# Patient Record
Sex: Male | Born: 1975 | Race: White | Hispanic: No | Marital: Married | Smoking: Current every day smoker
Health system: Southern US, Community
[De-identification: ages and names within clinical notes are randomized; demographics above are authoritative.]

## PROBLEM LIST (undated history)

## (undated) DIAGNOSIS — Z720 Tobacco use: Secondary | ICD-10-CM

## (undated) DIAGNOSIS — Z860100 Personal history of colon polyps, unspecified: Secondary | ICD-10-CM

## (undated) DIAGNOSIS — M1990 Unspecified osteoarthritis, unspecified site: Secondary | ICD-10-CM

## (undated) DIAGNOSIS — R0600 Dyspnea, unspecified: Secondary | ICD-10-CM

## (undated) DIAGNOSIS — R519 Headache, unspecified: Secondary | ICD-10-CM

## (undated) DIAGNOSIS — R635 Abnormal weight gain: Secondary | ICD-10-CM

## (undated) DIAGNOSIS — J189 Pneumonia, unspecified organism: Secondary | ICD-10-CM

## (undated) DIAGNOSIS — R42 Dizziness and giddiness: Secondary | ICD-10-CM

## (undated) DIAGNOSIS — G4730 Sleep apnea, unspecified: Secondary | ICD-10-CM

## (undated) DIAGNOSIS — R9431 Abnormal electrocardiogram [ECG] [EKG]: Secondary | ICD-10-CM

## (undated) DIAGNOSIS — J40 Bronchitis, not specified as acute or chronic: Secondary | ICD-10-CM

## (undated) DIAGNOSIS — R609 Edema, unspecified: Secondary | ICD-10-CM

## (undated) DIAGNOSIS — R5383 Other fatigue: Secondary | ICD-10-CM

## (undated) HISTORY — DX: Personal history of colonic polyps: Z86.010

## (undated) HISTORY — DX: Abnormal weight gain: R63.5

## (undated) HISTORY — PX: OTHER SURGICAL HISTORY: SHX169

## (undated) HISTORY — DX: Tobacco use: Z72.0

## (undated) HISTORY — DX: Edema, unspecified: R60.9

## (undated) HISTORY — DX: Abnormal electrocardiogram (ECG) (EKG): R94.31

## (undated) HISTORY — PX: NECK SURGERY: SHX720

## (undated) HISTORY — DX: Other fatigue: R53.83

## (undated) HISTORY — DX: Dyspnea, unspecified: R06.00

## (undated) HISTORY — PX: POLYPECTOMY: SHX149

## (undated) HISTORY — DX: Personal history of colon polyps, unspecified: Z86.0100

## (undated) HISTORY — DX: Dizziness and giddiness: R42

## (undated) HISTORY — DX: Morbid (severe) obesity due to excess calories: E66.01

## (undated) HISTORY — DX: Sleep apnea, unspecified: G47.30

---

## 1999-09-07 HISTORY — PX: COLONOSCOPY: SHX174

## 2000-02-25 ENCOUNTER — Ambulatory Visit (HOSPITAL_COMMUNITY): Admission: RE | Admit: 2000-02-25 | Discharge: 2000-02-25 | Payer: Self-pay | Admitting: *Deleted

## 2002-07-26 ENCOUNTER — Emergency Department (HOSPITAL_COMMUNITY): Admission: EM | Admit: 2002-07-26 | Discharge: 2002-07-26 | Payer: Self-pay | Admitting: Emergency Medicine

## 2006-03-22 ENCOUNTER — Encounter: Admission: RE | Admit: 2006-03-22 | Discharge: 2006-03-22 | Payer: Self-pay | Admitting: Family Medicine

## 2006-09-09 ENCOUNTER — Emergency Department (HOSPITAL_COMMUNITY): Admission: EM | Admit: 2006-09-09 | Discharge: 2006-09-09 | Payer: Self-pay | Admitting: Family Medicine

## 2020-02-08 ENCOUNTER — Telehealth: Payer: Self-pay

## 2020-02-08 NOTE — Telephone Encounter (Signed)
NOTES ON FILE FROM [facility] [phone], SENT REFERRAL TO SCHEDULING 

## 2020-02-11 ENCOUNTER — Encounter: Payer: Self-pay | Admitting: *Deleted

## 2020-04-02 ENCOUNTER — Other Ambulatory Visit: Payer: Self-pay

## 2020-04-02 ENCOUNTER — Telehealth: Payer: Self-pay | Admitting: *Deleted

## 2020-04-02 ENCOUNTER — Encounter: Payer: Self-pay | Admitting: Cardiology

## 2020-04-02 ENCOUNTER — Ambulatory Visit (INDEPENDENT_AMBULATORY_CARE_PROVIDER_SITE_OTHER): Payer: 59 | Admitting: Cardiology

## 2020-04-02 VITALS — BP 126/70 | HR 103 | Ht 77.0 in | Wt 339.0 lb

## 2020-04-02 DIAGNOSIS — R9431 Abnormal electrocardiogram [ECG] [EKG]: Secondary | ICD-10-CM

## 2020-04-02 DIAGNOSIS — F17200 Nicotine dependence, unspecified, uncomplicated: Secondary | ICD-10-CM | POA: Diagnosis not present

## 2020-04-02 DIAGNOSIS — Z8249 Family history of ischemic heart disease and other diseases of the circulatory system: Secondary | ICD-10-CM | POA: Diagnosis not present

## 2020-04-02 NOTE — Telephone Encounter (Signed)
Opened in error

## 2020-04-02 NOTE — Progress Notes (Signed)
Cardiology Office Note:    Date:  04/02/2020   ID:  Jorge Mcmahon, DOB 20-Oct-1975, MRN [id number]  PCP:  Patient, No Pcp Per  [facility] Cardiologist:  Candee Furbish, MD  [facility] Electrophysiologist:  None   Referring MD: Philmore Pali, NP     History of Present Illness:    Jorge Mcmahon is a 44 y.o. male here for the evaluation of abnormal EKG at the request of Charlott Holler, NP.  In review of outside records, he had been feeling poorly with decreased exercise tolerance dyspnea edema fatigue lightheadedness weight gain.  Has tobacco use family history of CAD with his father dying from heart failure and uncle dying from massive MI at age 74.  During his occupational physicals over the past 2 years each time his EKG has shown prolonged QT and this was new to the patient.  He works as a Dealer.  LDL 91 triglycerides 92.   BMI 40, 328 pounds previously.  -Covid test was negative.  QTC on EKG from outside hospital personally interpreted demonstrates 444 ms, QT is 390 ms.  Sinus rhythm otherwise.  Normal QRS duration of 107 ms.  This EKG was on 01/28/2020.  Additional EKG shows QTC of 426 ms. In general, the 99th percentile QTc values are 460 milliseconds (prepuberty), 470 milliseconds in postpubertal males, and 480 milliseconds in postpubertal females.   He has not complained of any high risk symptoms such as unexplained syncope or syncope during exercise.  Wife present during visit  Past Medical History:  Diagnosis Date  . Abnormal EKG   . Dyspnea   . Edema   . Fatigue   . Lightheaded   . Morbid obesity ([place])   . Tobacco abuse   . Weight gain     Current Medications: No outpatient medications have been marked as taking for the 04/02/20 encounter (Office Visit) with Jerline Pain, MD.     Allergies:   Sulfa antibiotics   Social History   Socioeconomic History  . Marital status: Married    Spouse name: Not on file  . Number of children: Not on file  . Years  of education: Not on file  . Highest education level: Not on file  Occupational History  . Not on file  Tobacco Use  . Smoking status: Current Every Day Smoker  . Smokeless tobacco: Never Used  Substance and Sexual Activity  . Alcohol use: Never  . Drug use: Never  . Sexual activity: Not on file  Other Topics Concern  . Not on file  Social History Narrative  . Not on file   Social Determinants of Health   Financial Resource Strain:   . Difficulty of Paying Living Expenses:   Food Insecurity:   . Worried About Charity fundraiser in the Last Year:   . Arboriculturist in the Last Year:   Transportation Needs:   . Film/video editor (Medical):   Marland Kitchen Lack of Transportation (Non-Medical):   Physical Activity:   . Days of Exercise per Week:   . Minutes of Exercise per Session:   Stress:   . Feeling of Stress :   Social Connections:   . Frequency of Communication with Friends and Family:   . Frequency of Social Gatherings with Friends and Family:   . Attends Religious Services:   . Active Member of Clubs or Organizations:   . Attends Archivist Meetings:   Marland Kitchen Marital Status:  Family History: The patient's family history includes Asthma in his maternal grandmother; Heart Problems in his maternal grandfather and paternal uncle; Heart disease in his father; Hyperlipidemia in his mother; Hypertension in his maternal grandfather and maternal grandmother.  ROS:   Please see the history of present illness.    Denies any fevers chills nausea vomiting syncope bleeding all other systems reviewed and are negative.  EKGs/Labs/Other Studies Reviewed:    The following studies were reviewed today: Prior EKGs reviewed and personally interpreted, all prior office notes reviewed  EKG:  EKG is  ordered today.  The ekg ordered today demonstrates sinus tachycardia 103 with QT interval measured at 376 ms, QTC currently measured by EKG at 492 however I think this is an  overestimation.  See chart for further details.  Recent Labs: No results found for requested labs within last 8760 hours.  Recent Lipid Panel No results found for: CHOL, TRIG, HDL, CHOLHDL, VLDL, LDLCALC, LDLDIRECT  Physical Exam:    VS:  BP 126/70   Pulse 103   Ht 6\' 5"  (1.956 m)   Wt (!) 339 lb (153.8 kg)   SpO2 94%   BMI 40.20 kg/m     Wt Readings from Last 3 Encounters:  04/02/20 (!) 339 lb (153.8 kg)     GEN: Overweight well nourished, well developed in no acute distress HEENT: Normal NECK: No JVD; No carotid bruits LYMPHATICS: No lymphadenopathy CARDIAC: RRR, no murmurs, rubs, gallops RESPIRATORY:  Clear to auscultation without rales, wheezing or rhonchi  ABDOMEN: Soft, non-tender, non-distended MUSCULOSKELETAL:  No edema; No deformity  SKIN: Warm and dry NEUROLOGIC:  Alert and oriented x 3 PSYCHIATRIC:  Normal affect   ASSESSMENT:    1. Family history of coronary artery disease   2. Abnormal electrocardiogram   3. Smoker    PLAN:    In order of problems listed above:  Abnormal ECG -This is borderline prolonged QT.  It is not on average greater than 470 ms QTC.  On caliper measurement, QT interval is actually 376 on current EKG.  He has not had any high risk symptoms such as unexplained syncope or syncope during exercise.  He has not had any young cousins or young first-degree relatives that have died suddenly. -I will check an echocardiogram to ensure proper structure and function of his heart especially given his symptoms of shortness of breath which could be multifactorial especially given weight, smoking.  He has describes some atypical chest discomfort which has been categorized as GERD in the past.  Many years ago he did have a stress test which was unremarkable on treadmill. -Avoid excessive QT prolonging drugs such as Zofran for instance or potentially quinolone antibiotics.  Family history of coronary artery disease -He does have an uncle at age 65 who  had a massive heart attack.  His father had heart failure in his 75s.  He is currently a smoker.  I would like to go ahead and check a coronary calcium score.  Obviously if this is elevated, we would likely encourage statin use, Crestor for instance.  He does not have familial hyperlipidemia, his LDL is under reasonable range.  Continue to encourage diet, exercise, tobacco cessation.  Tobacco use -Continue to encourage tobacco cessation.  We will follow-up with results of study.    Medication Adjustments/Labs and Tests Ordered: Current medicines are reviewed at length with the patient today.  Concerns regarding medicines are outlined above.  Orders Placed This Encounter  Procedures  . CT CARDIAC  SCORING  . EKG 12-Lead  . ECHOCARDIOGRAM COMPLETE   No orders of the defined types were placed in this encounter.   Patient Instructions  Medication Instructions:  The current medical regimen is effective;  continue present plan and medications.  *If you need a refill on your cardiac medications before your next appointment, please call your pharmacy*  Testing/Procedures: Your physician has requested that you have Coronary Calcium Score which is completed by Cardiac computed tomography (CT) is a painless test that uses an x-ray machine to take clear, detailed pictures of your heart. This testing is completed here at this office location.   The cost is $150 due at the time of the testing.  Your physician has requested that you have an echocardiogram. Echocardiography is a painless test that uses sound waves to create images of your heart. It provides your doctor with information about the size and shape of your heart and how well your heart's chambers and valves are working. This procedure takes approximately one hour. There are no restrictions for this procedure.  Follow-Up: At [facility], you and your health needs are our priority.  As part of our continuing mission to provide you with  exceptional heart care, we have created designated Provider Care Teams.  These Care Teams include your primary Cardiologist (physician) and Advanced Practice Providers (APPs -  Physician Assistants and Nurse Practitioners) who all work together to provide you with the care you need, when you need it.  We recommend signing up for the patient portal called "MyChart".  Sign up information is provided on this After Visit Summary.  MyChart is used to connect with patients for Virtual Visits (Telemedicine).  Patients are able to view lab/test results, encounter notes, upcoming appointments, etc.  Non-urgent messages can be sent to your provider as well.   To learn more about what you can do with MyChart, go to [email or web address].    Further follow up will be based on the results of the above testing.  Thank you for choosing [facility]!!        Signed, Candee Furbish, MD  04/02/2020 10:26 AM     [facility]

## 2020-04-02 NOTE — Patient Instructions (Addendum)
Medication Instructions:  The current medical regimen is effective;  continue present plan and medications.  *If you need a refill on your cardiac medications before your next appointment, please call your pharmacy*  Testing/Procedures: Your physician has requested that you have Coronary Calcium Score which is completed by Cardiac computed tomography (CT) is a painless test that uses an x-ray machine to take clear, detailed pictures of your heart. This testing is completed here at this office location.   The cost is $150 due at the time of the testing.  Your physician has requested that you have an echocardiogram. Echocardiography is a painless test that uses sound waves to create images of your heart. It provides your doctor with information about the size and shape of your heart and how well your heart's chambers and valves are working. This procedure takes approximately one hour. There are no restrictions for this procedure.  Follow-Up: At [facility], you and your health needs are our priority.  As part of our continuing mission to provide you with exceptional heart care, we have created designated Provider Care Teams.  These Care Teams include your primary Cardiologist (physician) and Advanced Practice Providers (APPs -  Physician Assistants and Nurse Practitioners) who all work together to provide you with the care you need, when you need it.  We recommend signing up for the patient portal called "MyChart".  Sign up information is provided on this After Visit Summary.  MyChart is used to connect with patients for Virtual Visits (Telemedicine).  Patients are able to view lab/test results, encounter notes, upcoming appointments, etc.  Non-urgent messages can be sent to your provider as well.   To learn more about what you can do with MyChart, go to [email or web address].    Further follow up will be based on the results of the above testing.  Thank you for choosing [facility]!!

## 2020-04-18 ENCOUNTER — Ambulatory Visit (INDEPENDENT_AMBULATORY_CARE_PROVIDER_SITE_OTHER)
Admission: RE | Admit: 2020-04-18 | Discharge: 2020-04-18 | Disposition: A | Discharge status: Home / Self Care | Payer: Self-pay | Source: Ambulatory Visit | Attending: Cardiology | Admitting: Cardiology

## 2020-04-18 ENCOUNTER — Other Ambulatory Visit: Payer: Self-pay

## 2020-04-18 ENCOUNTER — Ambulatory Visit (HOSPITAL_COMMUNITY): Payer: 59 | Attending: Cardiology

## 2020-04-18 DIAGNOSIS — Z8249 Family history of ischemic heart disease and other diseases of the circulatory system: Secondary | ICD-10-CM

## 2020-04-18 DIAGNOSIS — R9431 Abnormal electrocardiogram [ECG] [EKG]: Secondary | ICD-10-CM

## 2020-04-18 DIAGNOSIS — F17200 Nicotine dependence, unspecified, uncomplicated: Secondary | ICD-10-CM

## 2020-04-18 LAB — ECHOCARDIOGRAM COMPLETE
Area-P 1/2: 3.27 cm2
S' Lateral: 3.2 cm

## 2020-04-18 NOTE — Progress Notes (Signed)
2D Echocardiogram has been completed.  Rhythm Wigfall, RCS 

## 2020-04-29 ENCOUNTER — Other Ambulatory Visit: Payer: Self-pay | Admitting: *Deleted

## 2020-04-29 DIAGNOSIS — R911 Solitary pulmonary nodule: Secondary | ICD-10-CM

## 2020-04-29 DIAGNOSIS — F17200 Nicotine dependence, unspecified, uncomplicated: Secondary | ICD-10-CM

## 2020-04-29 NOTE — Progress Notes (Signed)
Calcium score is 0, no evidence of coronary calcification. Excellent. Low risk. Left upper lobe 3 mm pulmonary nodule. Given current smoking status, recommend repeating noncontrast chest CT in 12 months. Continue to encourage tobacco cessation. Please order CT of chest for 1 year. Marland Kitchen..

## 2020-07-11 ENCOUNTER — Encounter: Payer: Self-pay | Admitting: Physician Assistant

## 2020-07-25 ENCOUNTER — Ambulatory Visit (INDEPENDENT_AMBULATORY_CARE_PROVIDER_SITE_OTHER): Payer: 59 | Admitting: Physician Assistant

## 2020-07-25 ENCOUNTER — Encounter: Payer: Self-pay | Admitting: Physician Assistant

## 2020-07-25 VITALS — BP 114/74 | HR 84 | Ht 77.0 in | Wt 348.8 lb

## 2020-07-25 DIAGNOSIS — Z86010 Personal history of colonic polyps: Secondary | ICD-10-CM

## 2020-07-25 DIAGNOSIS — Z8371 Family history of colonic polyps: Secondary | ICD-10-CM | POA: Diagnosis not present

## 2020-07-25 DIAGNOSIS — G4733 Obstructive sleep apnea (adult) (pediatric): Secondary | ICD-10-CM | POA: Diagnosis not present

## 2020-07-25 MED ORDER — SUTAB 1479-225-188 MG PO TABS: 1.0000 | ORAL_TABLET | ORAL | 0 refills | Status: DC

## 2020-07-25 NOTE — Patient Instructions (Signed)
If you are age 43 or older, your body mass index should be between 23-30. Your Body mass index is 41.36 kg/m. If this is out of the aforementioned range listed, please consider follow up with your Primary Care Provider.  If you are age 32 or younger, your body mass index should be between 19-25. Your Body mass index is 41.36 kg/m. If this is out of the aformentioned range listed, please consider follow up with your Primary Care Provider.   You have been scheduled for a colonoscopy. Please follow written instructions given to you at your visit today.  Please pick up your prep supplies at the pharmacy within the next 1-3 days. If you use inhalers (even only as needed), please bring them with you on the day of your procedure.  Follow up pending the results of your Colonoscopy or as needed.

## 2020-07-25 NOTE — Progress Notes (Signed)
Agree with assessment and plan as outlined.  

## 2020-07-25 NOTE — Progress Notes (Signed)
Subjective:    Patient ID: Jorge Mcmahon, male    DOB: [birth year], 44 y.o.   MRN: [id number]  HPI Jorge Mcmahon is a 44 year old white male, new to GI today and self-referred to discuss colonoscopy for history of colon polyps. Patient is accompanied by his wife today and they relate that he had a colonoscopy at [facility] almost 20 years ago for rectal bleeding.  He was told that he had polyps which were removed and that he should have 5-year interval follow-up.  He did not pursue that. He is not having any current GI symptoms, specifically denies any changes in bowel habits, no melena or hematochezia, denies any abdominal pain.  He does have occasional heartburn for which he uses Tums, no dysphagia or odynophagia.  It sounds as if his wife has been encouraging him to have follow-up colonoscopy.  Family history is negative for colon cancer and polyps as far as he is aware. Other medical issues include obesity with BMI of 41.3, sleep apnea for which he occasionally uses a CPAP, no O2.  He is not on any regular medications.  Review of Systems Pertinent positive and negative review of systems were noted in the above HPI section.  All other review of systems was otherwise negative.  Outpatient Encounter Medications as of 07/25/2020  Medication Sig  . Sodium Sulfate-Mag Sulfate-KCl (SUTAB) [phone] MG TABS Take 1 kit by mouth as directed. MANUFACTURER CODES!! BIN: [id number] PCN: CN GROUP: [id number] MEMBER ID: [id number];[employer] AS SECONDARY INSURANCE ;NO PRIOR AUTHORIZATION   No facility-administered encounter medications on file as of 07/25/2020.   Allergies  Allergen Reactions  . Sulfa Antibiotics    Patient Active Problem List   Diagnosis Date Noted  . Severe obesity (BMI >= 40) ([place]) 07/25/2020  . OSA (obstructive sleep apnea) 07/25/2020  . Hx of adenomatous colonic polyps 07/25/2020   Social History   Socioeconomic History  . Marital status: Married    Spouse name: Not on file  . Number of  children: Not on file  . Years of education: Not on file  . Highest education level: Not on file  Occupational History  . Not on file  Tobacco Use  . Smoking status: Current Every Day Smoker  . Smokeless tobacco: Never Used  Substance and Sexual Activity  . Alcohol use: Never  . Drug use: Never  . Sexual activity: Not on file  Other Topics Concern  . Not on file  Social History Narrative  . Not on file   Social Determinants of Health   Financial Resource Strain:   . Difficulty of Paying Living Expenses: Not on file  Food Insecurity:   . Worried About Charity fundraiser in the Last Year: Not on file  . Ran Out of Food in the Last Year: Not on file  Transportation Needs:   . Lack of Transportation (Medical): Not on file  . Lack of Transportation (Non-Medical): Not on file  Physical Activity:   . Days of Exercise per Week: Not on file  . Minutes of Exercise per Session: Not on file  Stress:   . Feeling of Stress : Not on file  Social Connections:   . Frequency of Communication with Friends and Family: Not on file  . Frequency of Social Gatherings with Friends and Family: Not on file  . Attends Religious Services: Not on file  . Active Member of Clubs or Organizations: Not on file  . Attends Archivist Meetings: Not on file  .  Marital Status: Not on file  Intimate Partner Violence:   . Fear of Current or Ex-Partner: Not on file  . Emotionally Abused: Not on file  . Physically Abused: Not on file  . Sexually Abused: Not on file    Jorge Mcmahon's family history includes Asthma in his maternal grandmother; Heart Problems in his maternal grandfather and paternal uncle; Heart disease in his father; Hyperlipidemia in his mother; Hypertension in his maternal grandfather and maternal grandmother; Stomach cancer in his paternal grandmother.      Objective:    Vitals:   07/25/20 0839  BP: 114/74  Pulse: 84    Physical Exam Well-developed well-nourished  Large  obese WM  in no acute distress.  Height, MCRFVO,360 BMI 41.36 accompanied by his wife  HEENT; nontraumatic normocephalic, EOMI, PER R LA, sclera anicteric. Oropharynx;not examined Neck; supple, no JVD Cardiovascular; regular rate and rhythm with S1-S2, no murmur rub or gallop Pulmonary; Clear bilaterally Abdomen; soft, obese nondistended, no palpable mass or hepatosplenomegaly, bowel sounds are active Rectal;not done today  Skin; benign exam, no jaundice rash or appreciable lesions Extremities; no clubbing cyanosis or edema skin warm and dry Neuro/Psych; alert and oriented x4, grossly nonfocal mood and affect appropriate       Assessment & Plan:   #64 44 year old white male with remote history of colon polyps presumably adenomatous as he had been told to have 5-year interval follow-up which he did not pursue.  Rule out recurrent adenomatous polyps, occult neoplasm  #2 obesity-BMI 41 #3  sleep apnea-uses CPAP intermittently no O2  Plan; Patient is signed a release and will obtain his previous records from [facility] Will go ahead and schedule for colonoscopy with Dr. Havery Moros.  Procedure was discussed in detail with patient including indications risks and benefits and he is agreeable to proceed. He has not completed COVID-19 vaccination and will have preprocedure testing.  Meka Lewan S Fareed Fung PA-C 07/25/2020   Cc: No ref. provider found

## 2020-08-06 ENCOUNTER — Other Ambulatory Visit: Payer: Self-pay | Admitting: Gastroenterology

## 2020-08-06 LAB — SARS CORONAVIRUS 2 (TAT 6-24 HRS): SARS Coronavirus 2: NEGATIVE

## 2020-08-08 ENCOUNTER — Encounter: Payer: Self-pay | Admitting: Gastroenterology

## 2020-08-08 ENCOUNTER — Ambulatory Visit (AMBULATORY_SURGERY_CENTER): Payer: 59 | Admitting: Gastroenterology

## 2020-08-08 ENCOUNTER — Other Ambulatory Visit: Payer: Self-pay

## 2020-08-08 VITALS — BP 140/99 | HR 93 | Temp 98.6°F | Resp 21 | Ht 77.0 in | Wt 348.0 lb

## 2020-08-08 DIAGNOSIS — D122 Benign neoplasm of ascending colon: Secondary | ICD-10-CM

## 2020-08-08 DIAGNOSIS — D123 Benign neoplasm of transverse colon: Secondary | ICD-10-CM

## 2020-08-08 DIAGNOSIS — D127 Benign neoplasm of rectosigmoid junction: Secondary | ICD-10-CM | POA: Diagnosis not present

## 2020-08-08 DIAGNOSIS — D120 Benign neoplasm of cecum: Secondary | ICD-10-CM | POA: Diagnosis not present

## 2020-08-08 DIAGNOSIS — Z860100 Personal history of colon polyps, unspecified: Secondary | ICD-10-CM

## 2020-08-08 HISTORY — PX: COLONOSCOPY: SHX174

## 2020-08-08 MED ORDER — SODIUM CHLORIDE 0.9 % IV SOLN: 500.0000 mL | Freq: Once | INTRAVENOUS | Status: DC

## 2020-08-08 NOTE — Progress Notes (Signed)
Called to room to assist during endoscopic procedure.  Patient ID and intended procedure confirmed with present staff. Received instructions for my participation in the procedure from the performing physician.  

## 2020-08-08 NOTE — Patient Instructions (Addendum)
Handouts given:  Polyps, Hemorrhoids Resume previous diet Continue current medications Await pathology results  YOU HAD AN ENDOSCOPIC PROCEDURE TODAY AT [place]:   Refer to the procedure report that was given to you for any specific questions about what was found during the examination.  If the procedure report does not answer your questions, please call your gastroenterologist to clarify.  If you requested that your care partner not be given the details of your procedure findings, then the procedure report has been included in a sealed envelope for you to review at your convenience later.  YOU SHOULD EXPECT: Some feelings of bloating in the abdomen. Passage of more gas than usual.  Walking can help get rid of the air that was put into your GI tract during the procedure and reduce the bloating. If you had a lower endoscopy (such as a colonoscopy or flexible sigmoidoscopy) you may notice spotting of blood in your stool or on the toilet paper. If you underwent a bowel prep for your procedure, you may not have a normal bowel movement for a few days.  Please Note:  You might notice some irritation and congestion in your nose or some drainage.  This is from the oxygen used during your procedure.  There is no need for concern and it should clear up in a day or so.  SYMPTOMS TO REPORT IMMEDIATELY:   Following lower endoscopy (colonoscopy or flexible sigmoidoscopy):  Excessive amounts of blood in the stool  Significant tenderness or worsening of abdominal pains  Swelling of the abdomen that is new, acute  Fever of 100F or higher  For urgent or emergent issues, a gastroenterologist can be reached at any hour by calling [phone]. Do not use MyChart messaging for urgent concerns.   DIET:  We do recommend a small meal at first, but then you may proceed to your regular diet.  Drink plenty of fluids but you should avoid alcoholic beverages for 24 hours.  ACTIVITY:  You should  plan to take it easy for the rest of today and you should NOT DRIVE or use heavy machinery until tomorrow (because of the sedation medicines used during the test).    FOLLOW UP: Our staff will call the number listed on your records 48-72 hours following your procedure to check on you and address any questions or concerns that you may have regarding the information given to you following your procedure. If we do not reach you, we will leave a message.  We will attempt to reach you two times.  During this call, we will ask if you have developed any symptoms of COVID 19. If you develop any symptoms (ie: fever, flu-like symptoms, shortness of breath, cough etc.) before then, please call [phone].  If you test positive for Covid 19 in the 2 weeks post procedure, please call and report this information to [place].    If any biopsies were taken you will be contacted by phone or by letter within the next 1-3 weeks.  Please call us at [phone] if you have not heard about the biopsies in 3 weeks.   SIGNATURES/CONFIDENTIALITY: You and/or your care partner have signed paperwork which will be entered into your electronic medical record.  These signatures attest to the fact that that the information above on your After Visit Summary has been reviewed and is understood.  Full responsibility of the confidentiality of this discharge information lies with you and/or your care-partner.YOU HAD AN ENDOSCOPIC PROCEDURE TODAY AT  [place]:   Refer to the procedure report that was given to you for any specific questions about what was found during the examination.  If the procedure report does not answer your questions, please call your gastroenterologist to clarify.  If you requested that your care partner not be given the details of your procedure findings, then the procedure report has been included in a sealed envelope for you to review at your convenience later.  YOU SHOULD EXPECT: Some feelings of  bloating in the abdomen. Passage of more gas than usual.  Walking can help get rid of the air that was put into your GI tract during the procedure and reduce the bloating. If you had a lower endoscopy (such as a colonoscopy or flexible sigmoidoscopy) you may notice spotting of blood in your stool or on the toilet paper. If you underwent a bowel prep for your procedure, you may not have a normal bowel movement for a few days.  Please Note:  You might notice some irritation and congestion in your nose or some drainage.  This is from the oxygen used during your procedure.  There is no need for concern and it should clear up in a day or so.  SYMPTOMS TO REPORT IMMEDIATELY:   Following lower endoscopy (colonoscopy or flexible sigmoidoscopy):  Excessive amounts of blood in the stool  Significant tenderness or worsening of abdominal pains  Swelling of the abdomen that is new, acute  Fever of 100F or higher  For urgent or emergent issues, a gastroenterologist can be reached at any hour by calling [phone]. Do not use MyChart messaging for urgent concerns  DIET:  We do recommend a small meal at first, but then you may proceed to your regular diet.  Drink plenty of fluids but you should avoid alcoholic beverages for 24 hours.  ACTIVITY:  You should plan to take it easy for the rest of today and you should NOT DRIVE or use heavy machinery until tomorrow (because of the sedation medicines used during the test).    FOLLOW UP: Our staff will call the number listed on your records 48-72 hours following your procedure to check on you and address any questions or concerns that you may have regarding the information given to you following your procedure. If we do not reach you, we will leave a message.  We will attempt to reach you two times.  During this call, we will ask if you have developed any symptoms of COVID 19. If you develop any symptoms (ie: fever, flu-like symptoms, shortness of breath, cough  etc.) before then, please call [phone].  If you test positive for Covid 19 in the 2 weeks post procedure, please call and report this information to [place].    If any biopsies were taken you will be contacted by phone or by letter within the next 1-3 weeks.  Please call us at [phone] if you have not heard about the biopsies in 3 weeks.   SIGNATURES/CONFIDENTIALITY: You and/or your care partner have signed paperwork which will be entered into your electronic medical record.  These signatures attest to the fact that that the information above on your After Visit Summary has been reviewed and is understood.  Full responsibility of the confidentiality of this discharge information lies with you and/or your care-partner.

## 2020-08-08 NOTE — Progress Notes (Signed)
Pt's states no medical or surgical changes since previsit or office visit.  ° °Vitals CW °

## 2020-08-08 NOTE — Progress Notes (Signed)
PT taken to PACU. Monitors in place. VSS. Report given to RN. 

## 2020-08-08 NOTE — Op Note (Signed)
[facility] Patient Name: Jorge Mcmahon Procedure Date: 08/08/2020 7:40 AM MRN: [id number] Endoscopist: Remo Lipps P. Havery Moros , MD Age: 44 Referring MD:  Date of Birth: 04-26-1976 Gender: Male Account #: [id number] Procedure:                Colonoscopy Indications:              High risk colon cancer surveillance: Personal                            history of colonic polyps (report of polyps removed                            on colonoscopy remotely) Medicines:                Monitored Anesthesia Care Procedure:                Pre-Anesthesia Assessment:                           - Prior to the procedure, a History and Physical                            was performed, and patient medications and                            allergies were reviewed. The patient's tolerance of                            previous anesthesia was also reviewed. The risks                            and benefits of the procedure and the sedation                            options and risks were discussed with the patient.                            All questions were answered, and informed consent                            was obtained. Prior Anticoagulants: The patient has                            taken no previous anticoagulant or antiplatelet                            agents. ASA Grade Assessment: III - A patient with                            severe systemic disease. After reviewing the risks                            and benefits, the patient was deemed in  satisfactory condition to undergo the procedure.                           After obtaining informed consent, the colonoscope                            was passed under direct vision. Throughout the                            procedure, the patient's blood pressure, pulse, and                            oxygen saturations were monitored continuously. The                            Colonoscope was introduced  through the anus and                            advanced to the the cecum, identified by                            appendiceal orifice and ileocecal valve. The                            colonoscopy was performed without difficulty. The                            patient tolerated the procedure. The quality of the                            bowel preparation was good. The ileocecal valve,                            appendiceal orifice, and rectum were photographed. Scope In: 7:47:54 AM Scope Out: 8:15:34 AM Scope Withdrawal Time: 0 hours 21 minutes 55 seconds  Total Procedure Duration: 0 hours 27 minutes 40 seconds  Findings:                 The perianal and digital rectal examinations were                            normal.                           Two sessile polyps were found in the cecum. The                            polyps were 3 mm in size. These polyps were removed                            with a cold snare. Resection and retrieval were                            complete.  A 6 to 7 mm polyp was found in the ascending colon.                            The polyp was sessile. The polyp was removed with a                            cold snare. Resection and retrieval were complete.                           A 4 mm polyp was found in the transverse colon. The                            polyp was sessile. The polyp was removed with a                            cold snare. Resection and retrieval were complete.                           Two sessile polyps were found in the recto-sigmoid                            colon. The polyps were 3 mm in size. These polyps                            were removed with a cold snare. Resection and                            retrieval were complete.                           Internal hemorrhoids were found during retroflexion.                           The exam was otherwise without abnormality. Of note                             the patient had a lot of coughing with anesthesia,                            significant amount of oral secretions suctioned,                            transient oxygen desaturation in light of OSA and                            tobacco use, which made sedation difficult and                            prolonged this exam. Complications:            No immediate complications. Estimated blood loss:  Minimal. Estimated Blood Loss:     Estimated blood loss was minimal. Impression:               - Two 3 mm polyps in the cecum, removed with a cold                            snare. Resected and retrieved.                           - One 6 to 7 mm polyp in the ascending colon,                            removed with a cold snare. Resected and retrieved.                           - One 4 mm polyp in the transverse colon, removed                            with a cold snare. Resected and retrieved.                           - Two 3 mm polyps at the recto-sigmoid colon,                            removed with a cold snare. Resected and retrieved.                           - Internal hemorrhoids.                           - The examination was otherwise normal. Recommendation:           - Patient has a contact number available for                            emergencies. The signs and symptoms of potential                            delayed complications were discussed with the                            patient. Return to normal activities tomorrow.                            Written discharge instructions were provided to the                            patient.                           - Resume previous diet.                           - Continue present medications.                           -  Await pathology results. Remo Lipps P. Havery Moros, MD 08/08/2020 8:24:29 AM This report has been signed electronically.

## 2020-08-12 ENCOUNTER — Telehealth: Payer: Self-pay | Admitting: *Deleted

## 2020-08-12 ENCOUNTER — Telehealth: Payer: Self-pay

## 2020-08-12 NOTE — Telephone Encounter (Signed)
  Follow up Call-  Call back number 08/08/2020  Post procedure Call Back phone  # [phone]  Permission to leave phone message Yes  Some recent data might be hidden     Patient questions:  Do you have a fever, pain , or abdominal swelling? No. Pain Score  0 *  Have you tolerated food without any problems? Yes.    Have you been able to return to your normal activities? Yes.    Do you have any questions about your discharge instructions: Diet   No. Medications  No. Follow up visit  No.  Do you have questions or concerns about your Care? No.  Actions: * If pain score is 4 or above: No action needed, pain <4.  1. Have you developed a fever since your procedure? no  2.   Have you had an respiratory symptoms (SOB or cough) since your procedure? no  3.   Have you tested positive for COVID 19 since your procedure no  4.   Have you had any family members/close contacts diagnosed with the COVID 19 since your procedure?  no   If yes to any of these questions please route to Joylene Deitrick, RN and Joella Prince, RN

## 2020-08-12 NOTE — Telephone Encounter (Signed)
  Follow up Call-  Call back number 08/08/2020  Post procedure Call Back phone  # [phone]  Permission to leave phone message Yes  Some recent data might be hidden     1st follow up call made.  NALM

## 2020-12-08 ENCOUNTER — Encounter: Payer: Self-pay | Admitting: Neurology

## 2021-02-16 ENCOUNTER — Institutional Professional Consult (permissible substitution): Payer: 59 | Admitting: Pulmonary Disease

## 2021-03-05 ENCOUNTER — Other Ambulatory Visit: Payer: Self-pay

## 2021-03-05 ENCOUNTER — Emergency Department (HOSPITAL_COMMUNITY)
Admission: EM | Admit: 2021-03-05 | Discharge: 2021-03-05 | Disposition: A | Discharge status: Home / Self Care | Payer: Worker's Compensation | Attending: Emergency Medicine | Admitting: Emergency Medicine

## 2021-03-05 ENCOUNTER — Encounter (HOSPITAL_COMMUNITY): Payer: Self-pay | Admitting: Emergency Medicine

## 2021-03-05 ENCOUNTER — Ambulatory Visit (HOSPITAL_COMMUNITY)
Admission: EM | Admit: 2021-03-05 | Discharge: 2021-03-05 | Disposition: A | Discharge status: Home / Self Care | Payer: Worker's Compensation

## 2021-03-05 ENCOUNTER — Emergency Department (HOSPITAL_COMMUNITY): Payer: Worker's Compensation

## 2021-03-05 DIAGNOSIS — S62639B Displaced fracture of distal phalanx of unspecified finger, initial encounter for open fracture: Secondary | ICD-10-CM

## 2021-03-05 DIAGNOSIS — S62633B Displaced fracture of distal phalanx of left middle finger, initial encounter for open fracture: Secondary | ICD-10-CM | POA: Insufficient documentation

## 2021-03-05 DIAGNOSIS — S6992XA Unspecified injury of left wrist, hand and finger(s), initial encounter: Secondary | ICD-10-CM | POA: Diagnosis present

## 2021-03-05 DIAGNOSIS — Z23 Encounter for immunization: Secondary | ICD-10-CM | POA: Diagnosis not present

## 2021-03-05 DIAGNOSIS — Y990 Civilian activity done for income or pay: Secondary | ICD-10-CM | POA: Insufficient documentation

## 2021-03-05 DIAGNOSIS — F17210 Nicotine dependence, cigarettes, uncomplicated: Secondary | ICD-10-CM | POA: Diagnosis not present

## 2021-03-05 DIAGNOSIS — W231XXA Caught, crushed, jammed, or pinched between stationary objects, initial encounter: Secondary | ICD-10-CM | POA: Insufficient documentation

## 2021-03-05 DIAGNOSIS — S61213A Laceration without foreign body of left middle finger without damage to nail, initial encounter: Secondary | ICD-10-CM

## 2021-03-05 HISTORY — DX: Pneumonia, unspecified organism: J18.9

## 2021-03-05 MED ORDER — CEPHALEXIN 250 MG PO CAPS
500.0000 mg | ORAL_CAPSULE | Freq: Once | ORAL | Status: AC
  Administered 2021-03-05: 500 mg via ORAL
  Filled 2021-03-05: qty 2

## 2021-03-05 MED ORDER — TETANUS-DIPHTH-ACELL PERTUSSIS 5-2.5-18.5 LF-MCG/0.5 IM SUSY
0.5000 mL | PREFILLED_SYRINGE | Freq: Once | INTRAMUSCULAR | Status: AC
  Administered 2021-03-05: 0.5 mL via INTRAMUSCULAR
  Filled 2021-03-05: qty 0.5

## 2021-03-05 MED ORDER — LIDOCAINE HCL (PF) 1 % IJ SOLN
5.0000 mL | Freq: Once | INTRAMUSCULAR | Status: AC
  Administered 2021-03-05: 5 mL
  Filled 2021-03-05: qty 5

## 2021-03-05 MED ORDER — CEPHALEXIN 500 MG PO CAPS: 500.0000 mg | ORAL_CAPSULE | Freq: Two times a day (BID) | ORAL | 0 refills | Status: AC

## 2021-03-05 NOTE — ED Notes (Signed)
Patient is being discharged from the Urgent Care and sent to the Emergency Department via pov . Per mani, pa, patient is in need of higher level of care due to severity of wound. Patient is aware and verbalizes understanding of plan of care.  Vitals:   03/05/21 1549  BP: 139/81  Pulse: (!) 114  Resp: (!) 24  Temp: 98.8 F (37.1 C)  SpO2: 94%

## 2021-03-05 NOTE — ED Provider Notes (Signed)
[facility] EMERGENCY DEPARTMENT Provider Note   CSN: [id number] Arrival date & time: 03/05/21  1608     History Chief Complaint  Patient presents with   Finger Injury    Jorge Mcmahon is a 45 y.o. male.  45 year old male with past medical history below who presents with left finger injury.  Around 1:30 PM today, patient caught his left middle finger in machinery at work, sustaining a laceration to his fingertip.  Bleeding currently controlled.  He reports moderate pain on his fingertips since then.  He denies any other injuries.  Unknown last tetanus.  No medications prior to arrival and no other injuries.  The history is provided by the patient.      Past Medical History:  Diagnosis Date   Abnormal EKG    Dyspnea    Edema    Fatigue    History of colon polyps    Lightheaded    Morbid obesity (HCC)    Pneumonia    Sleep apnea    uses cpap   Tobacco abuse    Weight gain     Patient Active Problem List   Diagnosis Date Noted   Severe obesity (BMI >= 40) ([place]) 07/25/2020   OSA (obstructive sleep apnea) 07/25/2020   Hx of adenomatous colonic polyps 07/25/2020    Past Surgical History:  Procedure Laterality Date   COLONOSCOPY  08/08/2020   [facility]   COLONOSCOPY  2001   [place], 5 polyps   none     POLYPECTOMY         Family History  Problem Relation Age of Onset   Heart disease Father        CHF   Hyperlipidemia Mother    Hypertension Maternal Grandmother    Asthma Maternal Grandmother    Hypertension Maternal Grandfather    Heart Problems Maternal Grandfather    Heart Problems Paternal Uncle        MI   Stomach cancer Paternal Grandmother    Colon polyps Neg Hx    Colon cancer Neg Hx    Pancreatic cancer Neg Hx    Esophageal cancer Neg Hx    Liver disease Neg Hx     Social History   Tobacco Use   Smoking status: Every Mcmahon    Pack years: 0.00    Types: Cigarettes   Smokeless tobacco: Never  Vaping Use   Vaping Use: Never  used  Substance Use Topics   Alcohol use: Never   Drug use: Never    Home Medications Prior to Admission medications   Medication Sig Start Date End Date Taking? Authorizing Provider  cephALEXin (KEFLEX) 500 MG capsule Take 1 capsule (500 mg total) by mouth 2 (two) times daily for 7 days. 03/05/21 03/12/21 Yes Melaine Mcphee, Wenda Overland, MD  amoxicillin-clavulanate (AUGMENTIN) 875-125 MG tablet Take 1 tablet by mouth 2 (two) times daily. 02/27/21   [provider]    Allergies    Sulfa antibiotics  Review of Systems   Review of Systems  Musculoskeletal:  Negative for joint swelling.  Skin:  Positive for wound.  Neurological:  Negative for numbness.   Physical Exam Updated Vital Signs BP (!) 140/103 (BP Location: Right Arm)   Pulse (!) 102   Temp 98.2 F (36.8 C) (Oral)   Resp 20   SpO2 94%   Physical Exam Vitals and nursing note reviewed.  Constitutional:      General: He is not in acute distress.    Appearance:  He is well-developed.  HENT:     Head: Normocephalic and atraumatic.  Eyes:     Conjunctiva/sclera: Conjunctivae normal.  Musculoskeletal:        General: Signs of injury present.     Cervical back: Neck supple.     Comments: Laceration of L middle finger tip involving both palmar and nail side, lac runs alongside nail, no active bleeding (see photo)  Skin:    General: Skin is warm and dry.  Neurological:     Mental Status: He is alert and oriented to person, place, and time.  Psychiatric:        Judgment: Judgment normal.       ED Results / Procedures / Treatments   Labs (all labs ordered are listed, but only abnormal results are displayed) Labs Reviewed - No data to display  EKG None  Radiology DG Finger Middle Left  Result Date: 03/05/2021 CLINICAL DATA:  Status post trauma. EXAM: LEFT MIDDLE FINGER 2+V COMPARISON:  None. FINDINGS: An acute fracture deformity is seen involving the tuft of the distal phalanx of the third left finger. Small  displaced fracture fragments are noted. There is no evidence of dislocation. An adjacent soft tissue laceration is seen. IMPRESSION: Acute fracture of the distal phalanx of the third left finger. Electronically Signed   By: Virgina Norfolk M.D.   On: 03/05/2021 18:21    Procedures Procedures   Medications Ordered in ED Medications  Tdap (BOOSTRIX) injection 0.5 mL (0.5 mLs Intramuscular Given 03/05/21 2027)  lidocaine (PF) (XYLOCAINE) 1 % injection 5 mL (5 mLs Infiltration Given 03/05/21 2206)  cephALEXin (KEFLEX) capsule 500 mg (500 mg Oral Given 03/05/21 2236)    ED Course  I have reviewed the triage vital signs and the nursing notes.  Pertinent imaging results that were available during my care of the patient were reviewed by me and considered in my medical decision making (see chart for details).    MDM Rules/Calculators/A&P                          Imaging shows distal phalanx fracture. Tdap updated, irrigated wound with betadine and water. Discussed w/ hand surgery, Dr. Grandville Silos, who recommended repair w/ vicryl rapide and will f/u with pt in the clinic.   Repair by PA Geiple. See procedure note. Pt placed in splint.  We will start her on course of Keflex.  I have wound care at home and extensively reviewed return precautions regarding signs of infection.  Patient voiced understanding. Final Clinical Impression(s) / ED Diagnoses Final diagnoses:  Open fracture of tuft of distal phalanx of finger  Laceration of left middle finger without foreign body without damage to nail, initial encounter    Rx / DC Orders ED Discharge Orders          Ordered    cephALEXin (KEFLEX) 500 MG capsule  2 times daily        03/05/21 2241             Sigmond Patalano, Wenda Overland, MD 03/05/21 2356

## 2021-03-05 NOTE — Progress Notes (Signed)
Contacted by Dr. Rex Kras regarding this patient's fingertip injury.  Reviewed history and clinical photos.  Recommended wound cleansing, ensuring tetanus is UTD, and re-approximation of wound with 4-0 Vicryl Rapide, followed by application of soft dressing, d/c with oral antibiotics.  My office will contact patient tomorrow to arrange for continued care for next week.  Micheline Rough, MD Hand Surgery

## 2021-03-05 NOTE — ED Provider Notes (Signed)
Patient seen at triage and has a deep left 3rd finger laceration with nail injury that penetrates through dorsal and ventral surface. He is in need of a higher level of care than we can provide in the urgent care setting. Contracts for safety and will report to the ER now.    Jaynee Eagles, PA-C 03/05/21 1550

## 2021-03-05 NOTE — ED Notes (Signed)
Rewrapped finger

## 2021-03-05 NOTE — ED Triage Notes (Signed)
Pt c/o "split in fingertip" on L middle finger, caught between 2 pieces of metal. Bleeding controlled in triage, seen at [facility] for same, "couldn't do anything because it's ripped." States he was able to flush wound "as best he could"

## 2021-03-05 NOTE — ED Notes (Signed)
Bess Harvest, pa saw patient injury

## 2021-03-05 NOTE — ED Notes (Signed)
Pt soaking wound in bedadine and water.

## 2021-03-05 NOTE — ED Notes (Signed)
Discharge pending ortho tech splint placement

## 2021-03-05 NOTE — Progress Notes (Signed)
Orthopedic Tech Progress Note Patient Details:  Jorge Mcmahon 1976/03/24 [id number]  Ortho Devices Type of Ortho Device: Finger splint Ortho Device/Splint Location: LUE Ortho Device/Splint Interventions: Ordered, Application, Adjustment   Post Interventions Patient Tolerated: Well Instructions Provided: Adjustment of device, Care of device, Poper ambulation with device  Jeryn Cerney 03/05/2021, 11:54 PM

## 2021-03-05 NOTE — ED Triage Notes (Signed)
D left middle finger tip between two pieces of metal.  This occurred about 2 :30 pm today.  Bleeding controlled finger tip is split in half.

## 2021-03-05 NOTE — ED Provider Notes (Signed)
..  Laceration Repair  Date/Time: 03/05/2021 10:29 PM Performed by: Carlisle Cater, PA-C Authorized by: Carlisle Cater, PA-C   Consent:    Consent obtained:  Verbal   Consent given by:  Patient   Risks discussed:  Pain, infection and need for additional repair Universal protocol:    Patient identity confirmed:  Verbally with patient Anesthesia:    Anesthesia method:  Local infiltration and nerve block   Local anesthetic:  Lidocaine 1% w/o epi   Block location:  Left long finger   Block needle gauge:  25 G   Block anesthetic:  Lidocaine 1% w/o epi   Block technique:  3-sided ring block   Block injection procedure:  Anatomic landmarks identified, introduced needle, incremental injection, negative aspiration for blood and anatomic landmarks palpated   Block outcome:  Anesthesia achieved Laceration details:    Location:  Finger   Finger location:  R long finger   Length (cm):  2 Pre-procedure details:    Preparation:  Patient was prepped and draped in usual sterile fashion and imaging obtained to evaluate for foreign bodies Exploration:    Imaging obtained: x-ray     Imaging outcome: foreign body not noted     Wound exploration: wound explored through full range of motion     Wound extent: underlying fracture     Wound extent: no foreign bodies/material noted     Contaminated: no   Treatment:    Area cleansed with:  Povidone-iodine   Amount of cleaning:  Standard   Irrigation solution:  Sterile saline   Irrigation volume:  1000cc   Irrigation method:  Pressure wash   Debridement:  None Skin repair:    Repair method:  Sutures   Suture size:  4-0   Wound skin closure material used: vicryl rapide.   Suture technique:  Simple interrupted   Number of sutures:  7 Approximation:    Approximation:  Close Repair type:    Repair type:  Simple Post-procedure details:    Procedure completion:  Tolerated well, no immediate complications    Carlisle Cater, PA-C 03/05/21 2355     Little, Wenda Overland, MD 03/06/21 0009

## 2021-03-05 NOTE — ED Provider Notes (Signed)
Emergency Medicine Provider Triage Evaluation Note  Jorge Mcmahon , a 45 y.o. male  was evaluated in triage.  Pt complains of injury and laceration to left third finger.  He reports that at 1330 his left middle finger was caught between 2 pieces of metal.  Causing a wound.  Patient has had pain to his finger since then.  Bleeding controlled with direct pressure.  Patient is left-hand dominant.  Last tetanus shot was.  Review of Systems  Positive: Wound Negative: Numbness, weakness, color change  Physical Exam  BP (!) 138/99 (BP Location: Right Arm)   Pulse (!) 115   Temp 98.1 F (36.7 C) (Oral)   Resp 18   SpO2 94%  Gen:   Awake, no distress   Resp:  Normal effort  MSK:   Moves extremities without difficulty, laceration with jagged edges on left third finger dorsum extending around to proximal finger to the left lateral nailbed.  Patient is approximately 3 cm in length.  Patient has full range of motion of left third finger.  Sensation intact to left third finger. Other:  +2 left radial pulse  Medical Decision Making  Medically screening exam initiated at 5:23 PM.  Appropriate orders placed.  Hyacinth Meeker was informed that the remainder of the evaluation will be completed by another provider, this initial triage assessment does not replace that evaluation, and the importance of remaining in the ED until their evaluation is complete.  The patient appears stable so that the remainder of the work up may be completed by another provider.      Loni Beckwith, PA-C 03/05/21 [place], [place], DO 03/05/21 1905

## 2021-03-06 ENCOUNTER — Ambulatory Visit: Payer: 59 | Admitting: Neurology

## 2021-03-24 ENCOUNTER — Encounter: Payer: Self-pay | Admitting: Pulmonary Disease

## 2021-03-24 ENCOUNTER — Ambulatory Visit (INDEPENDENT_AMBULATORY_CARE_PROVIDER_SITE_OTHER): Payer: 59 | Admitting: Pulmonary Disease

## 2021-03-24 ENCOUNTER — Other Ambulatory Visit: Payer: Self-pay

## 2021-03-24 VITALS — BP 136/84 | HR 95 | Ht 77.0 in | Wt 347.8 lb

## 2021-03-24 DIAGNOSIS — R0600 Dyspnea, unspecified: Secondary | ICD-10-CM

## 2021-03-24 DIAGNOSIS — R0609 Other forms of dyspnea: Secondary | ICD-10-CM

## 2021-03-24 DIAGNOSIS — J339 Nasal polyp, unspecified: Secondary | ICD-10-CM | POA: Diagnosis not present

## 2021-03-24 DIAGNOSIS — F17210 Nicotine dependence, cigarettes, uncomplicated: Secondary | ICD-10-CM | POA: Diagnosis not present

## 2021-03-24 MED ORDER — ALBUTEROL SULFATE HFA 108 (90 BASE) MCG/ACT IN AERS: 2.0000 | INHALATION_SPRAY | Freq: Four times a day (QID) | RESPIRATORY_TRACT | 11 refills | Status: DC | PRN

## 2021-03-24 MED ORDER — FLUTICASONE-SALMETEROL 250-50 MCG/ACT IN AEPB: 1.0000 | INHALATION_SPRAY | Freq: Two times a day (BID) | RESPIRATORY_TRACT | 11 refills | Status: DC

## 2021-03-24 NOTE — Progress Notes (Signed)
@Patient  ID: Jorge Mcmahon, male    DOB: 10/29/1975, 45 y.o.   MRN: [id number]  Chief Complaint  Patient presents with   Consult    Referred by Cyndi Bender PA for DOE. Had COVID back in 2020. Has to complete a PFT each year for work. Has a consult on Friday with ENT to discuss nasal polyps.     Referring provider: Cyndi Bender, PA-C  HPI:   45 year old whom we are seeing in consultation for evaluation of dyspnea on exertion.  PCP note reviewed.  Patient is dyspnea on exertion over the last 2 or 3 years.  Worse with inclines or stairs.  Can occur on flat surfaces as well.  Relatively stable over the last 2 or 3 years.  He worse over the last year or so.  Associated worsening symptoms include worsening nasal congestion, seasonal allergies.  Diagnosed with nasal polyps by ENT March 2022.  No other environmental factors he can identify that make things better or worse.  No positional things better or worse.  No time of day when things are better or worse.  Has not tried any medication or anything to help with this.  He notes that about 3 weeks ago he was diagnosed with flu and told he had pneumonia based on chest x-ray.  He does not know the exact results of the chest x-ray.  He was given antibiotics.  Albuterol inhaler at that time.  It did help the cough associated with his acute illness.  Otherwise never used inhalers.  Most recent chest imaging 04/2020 CT coronary reviewed with what I believed to be subtle mild emphysematous changes that can be seen in the middle portion of the lungs peripherally, otherwise clear lungs.  PMH: Seasonal allergies, recurrent pneumonias, nasal polyps Surgical history: Reviewed with patient, denies any Family history: CAD in father Social history: Current smoker, 1 pack/day for many years, lives in [place] city-grew up there, works as Higher education careers adviser / Pulmonary Flowsheets:   ACT:  No flowsheet data found.  MMRC: No flowsheet data  found.  Epworth:  No flowsheet data found.  Tests:   FENO:  No results found for: NITRICOXIDE  PFT: No flowsheet data found.  WALK:  No flowsheet data found.  Imaging: Personally reviewed as per EMR discussion this note  Lab Results:  CBC No results found for: WBC, RBC, HGB, HCT, PLT, MCV, MCH, MCHC, RDW, LYMPHSABS, MONOABS, EOSABS, BASOSABS  BMET No results found for: NA, K, CL, CO2, GLUCOSE, BUN, CREATININE, CALCIUM, GFRNONAA, GFRAA  BNP No results found for: BNP  ProBNP No results found for: PROBNP  Specialty Problems       Pulmonary Problems   OSA (obstructive sleep apnea)    Uses CPAP as needed        Allergies  Allergen Reactions   Sulfa Antibiotics     Immunization History  Administered Date(s) Administered   Tdap 03/05/2021    Past Medical History:  Diagnosis Date   Abnormal EKG    Dyspnea    Edema    Fatigue    History of colon polyps    Lightheaded    Morbid obesity (HCC)    Pneumonia    Sleep apnea    uses cpap   Tobacco abuse    Weight gain     Tobacco History: Social History   Tobacco Use  Smoking Status Every Day   Packs/day: 1.00   Types: Cigarettes   Start date: 10/11/1991  Smokeless Tobacco Never  Ready to quit: Not Answered Counseling given: Not Answered   Continue to not smoke  Outpatient Encounter Medications as of 44/19/2022  Medication Sig   fluticasone-salmeterol (ADVAIR) 250-50 MCG/ACT AEPB Inhale 1 puff into the lungs in the morning and at bedtime.   [DISCONTINUED] albuterol (VENTOLIN HFA) 108 (90 Base) MCG/ACT inhaler SMARTSIG:2 Puff(s) By Mouth Every 4-6 Hours PRN   albuterol (VENTOLIN HFA) 108 (90 Base) MCG/ACT inhaler Inhale 2 puffs into the lungs every 6 (six) hours as needed for wheezing or shortness of breath.   [DISCONTINUED] amoxicillin-clavulanate (AUGMENTIN) 875-125 MG tablet Take 1 tablet by mouth 2 (two) times daily.   No facility-administered encounter medications on file as of 44/19/2022.      Review of Systems  Review of Systems  No chest pain with exertion.  No orthopnea or PND.  No lower extremity swelling.  Comprehensive review of systems otherwise negative. Physical Exam  BP 136/84   Pulse 95   Ht 6\' 5"  (1.956 m)   Wt (!) 347 lb 12.8 oz (157.8 kg)   SpO2 97% Comment: on RA  BMI 41.24 kg/m   Wt Readings from Last 5 Encounters:  03/24/21 (!) 347 lb 12.8 oz (157.8 kg)  08/08/20 (!) 348 lb (157.9 kg)  07/25/20 (!) 348 lb 12.8 oz (158.2 kg)  04/02/20 (!) 339 lb (153.8 kg)    BMI Readings from Last 5 Encounters:  03/24/21 41.24 kg/m  08/08/20 41.27 kg/m  07/25/20 41.36 kg/m  04/02/20 40.20 kg/m     Physical Exam General: Sitting in chair, in no acute distress Eyes: EOMI, no icterus Neck: Supple, no JVP appreciated Cardiovascular: Regular rate and rhythm, no murmur Pulmonary: Clear to auscultation bilaterally, normal work of breathing, distant sounds Abdomen: Nondistended, bowel sounds present Skin: No synovitis, joint effusion Neuro: Normal gait, no weakness Psych: Normal mood, full affect   Assessment & Plan:   Dyspnea on exertion: Likely multifactorial.  Prior spirometry suggestive of moderate restriction versus gas trapping.  Given his habitus, suspect element of restriction from body wall/chest wall weight and interference.  Notably, weight is up about 40 pounds since spirometry available to view in 2019.  He is a smoker so it is possible there is gas trapping related to possible subtle emphysema seen on prior imaging.  Lastly, worsening symptoms in the setting of worsening seasonal allergies and nasal polyps begs the question of possible underlying asthma.  We will treat with mid dose Advair.  Notably, some relief and coughing during acute illness with albuterol.  PFTs at next visit.  Delaying given recent diagnosis of flu and pneumonia a couple weeks ago in an effort to get his body time to heal and get accurate representation on PFTs.  Chest  x-ray performed 02/2021 at [facility], cannot see results or images.  We will request these for review.  Tobacco abuse:Smoking assessment and cessation counseling for 8 minutes. I have advised the patient to quit/stop smoking as soon as possible due to high risk for multiple medical problems.  It will also be very difficult for [place] to manage patient's  respiratory symptoms and status if we continue to expose her lungs to a known irritant.  We do not advise e-cigarettes as a form of stopping smoking. Patient is not willing to quit smoking. I have advised the patient that we can assist and have options of nicotine replacement therapy, provided smoking cessation education today, provided smoking cessation counseling, and provided cessation resources.Follow-up next office visit office visit for assessment  of smoking cessation.   Return in about 3 months (around 06/24/2021).   Lanier Clam, MD 03/24/2021

## 2021-03-24 NOTE — Patient Instructions (Addendum)
Nice to meet you  To try to help with the breathing, use Advair 1 puff twice a day.  Rinse your mouth out with water after every use.  I sent a refill for the albuterol inhaler.  You can use this as needed for shortness of breath.  It is a good idea to keep this on you are in your vehicle at all times.  We will get pulmonary function tests or breathing test when you return.  This is similar to the spirometry you performed at your job but includes extra test to help [place] determine what could be contributing to your shortness of breath.  If the co-pay for the inhaler is too high, please ask your pharmacist what the preferred "inhaled corticosteroid and long-acting beta agonist" is for your insurance plan.  If they cannot be of assistance, I recommend you call [employer] and ask them what the preferred "inhaled corticosteroid and long-acting beta agonist" is for your insurance plan.  Once you find out, please call us and I am happy to provide a prescription for this.  Return to clinic in 3 months for follow-up with Dr. Silas Flood, PFTs same day.

## 2021-05-26 ENCOUNTER — Other Ambulatory Visit: Payer: Self-pay | Admitting: Otolaryngology

## 2021-06-02 NOTE — Pre-Procedure Instructions (Signed)
Surgical Instructions    Your procedure is scheduled on Wednesday 06/10/21.   Report to [facility] Main Entrance "A" at 06:30 A.M., then check in with the Admitting office.  Call this number if you have problems the morning of surgery:  [phone]   If you have any questions prior to your surgery date call [phone]: Open Monday-Friday 8am-4pm    Remember:  Do not eat or drink after midnight the night before your surgery    Take these medicines the morning of surgery:   acetaminophen (TYLENOL)- If needed  albuterol (VENTOLIN HFA) - If needed  fluticasone-salmeterol (ADVAIR)- If needed   As of today, STOP taking any Aspirin (unless otherwise instructed by your surgeon) Aleve, Naproxen, Ibuprofen, Motrin, Advil, Goody's, BC's, all herbal medications, fish oil, and all vitamins.                     Do NOT Smoke (Tobacco/Vaping) or drink Alcohol 24 hours prior to your procedure.  If you use a CPAP at night, you may bring all equipment for your overnight stay.   Contacts, glasses, piercing's, hearing aid's, dentures or partials may not be worn into surgery, please bring cases for these belongings.    For patients admitted to the hospital, discharge time will be determined by your treatment team.   Patients discharged the day of surgery will not be allowed to drive home, and someone needs to stay with them for 24 hours.  ONLY 1 SUPPORT PERSON MAY BE PRESENT WHILE YOU ARE IN SURGERY. IF YOU ARE TO BE ADMITTED ONCE YOU ARE IN YOUR ROOM YOU WILL BE ALLOWED TWO (2) VISITORS.  Minor children may have two parents present. Special consideration for safety and communication needs will be reviewed on a case by case basis.   Special instructions:   [facility]- Preparing For Surgery  Before surgery, you can play an important role. Because skin is not sterile, your skin needs to be as free of germs as possible. You can reduce the number of germs on your skin by washing with CHG  (chlorahexidine gluconate) Soap before surgery.  CHG is an antiseptic cleaner which kills germs and bonds with the skin to continue killing germs even after washing.    Oral Hygiene is also important to reduce your risk of infection.  Remember - BRUSH YOUR TEETH THE MORNING OF SURGERY WITH YOUR REGULAR TOOTHPASTE  Please do not use if you have an allergy to CHG or antibacterial soaps. If your skin becomes reddened/irritated stop using the CHG.  Do not shave (including legs and underarms) for at least 48 hours prior to first CHG shower. It is OK to shave your face.  Please follow these instructions carefully.   Shower the NIGHT BEFORE SURGERY and the MORNING OF SURGERY  If you chose to wash your hair, wash your hair first as usual with your normal shampoo.  After you shampoo, rinse your hair and body thoroughly to remove the shampoo.  Use CHG Soap as you would any other liquid soap. You can apply CHG directly to the skin and wash gently with a scrungie or a clean washcloth.   Apply the CHG Soap to your body ONLY FROM THE NECK DOWN.  Do not use on open wounds or open sores. Avoid contact with your eyes, ears, mouth and genitals (private parts). Wash Face and genitals (private parts)  with your normal soap.   Wash thoroughly, paying special attention to the area where your surgery  will be performed.  Thoroughly rinse your body with warm water from the neck down.  DO NOT shower/wash with your normal soap after using and rinsing off the CHG Soap.  Pat yourself dry with a CLEAN TOWEL.  Wear CLEAN PAJAMAS to bed the night before surgery  Place CLEAN SHEETS on your bed the night before your surgery  DO NOT SLEEP WITH PETS.   Day of Surgery: Shower with CHG soap. Do not wear jewelry, make up, nail polish, gel polish, artificial nails, or any other type of covering on natural nails including finger and toenails. If patients have artificial nails, gel coating, etc. that need to be removed  by a nail salon please have this removed prior to surgery. Surgery may need to be canceled/delayed if the surgeon/ anesthesia feels like the patient is unable to be adequately monitored. Do not wear lotions, powders, perfumes/colognes, or deodorant. Do not shave 48 hours prior to surgery.  Men may shave face and neck. Do not bring valuables to the hospital. [facility] is not responsible for any belongings or valuables. Wear Clean/Comfortable clothing the morning of surgery Remember to brush your teeth WITH YOUR REGULAR TOOTHPASTE.   Please read over the following fact sheets that you were given.

## 2021-06-03 ENCOUNTER — Encounter (HOSPITAL_COMMUNITY): Payer: Self-pay

## 2021-06-03 ENCOUNTER — Other Ambulatory Visit: Payer: Self-pay

## 2021-06-03 ENCOUNTER — Encounter (HOSPITAL_COMMUNITY)
Admission: RE | Admit: 2021-06-03 | Discharge: 2021-06-03 | Disposition: A | Discharge status: Home / Self Care | Payer: 59 | Source: Ambulatory Visit | Attending: Otolaryngology | Admitting: Otolaryngology

## 2021-06-03 DIAGNOSIS — J342 Deviated nasal septum: Secondary | ICD-10-CM | POA: Diagnosis not present

## 2021-06-03 DIAGNOSIS — K219 Gastro-esophageal reflux disease without esophagitis: Secondary | ICD-10-CM | POA: Diagnosis not present

## 2021-06-03 DIAGNOSIS — R609 Edema, unspecified: Secondary | ICD-10-CM | POA: Diagnosis not present

## 2021-06-03 DIAGNOSIS — G4733 Obstructive sleep apnea (adult) (pediatric): Secondary | ICD-10-CM | POA: Diagnosis not present

## 2021-06-03 DIAGNOSIS — J343 Hypertrophy of nasal turbinates: Secondary | ICD-10-CM | POA: Insufficient documentation

## 2021-06-03 DIAGNOSIS — J3489 Other specified disorders of nose and nasal sinuses: Secondary | ICD-10-CM | POA: Insufficient documentation

## 2021-06-03 DIAGNOSIS — Z791 Long term (current) use of non-steroidal anti-inflammatories (NSAID): Secondary | ICD-10-CM | POA: Diagnosis not present

## 2021-06-03 DIAGNOSIS — Z8616 Personal history of COVID-19: Secondary | ICD-10-CM | POA: Insufficient documentation

## 2021-06-03 DIAGNOSIS — Z6841 Body Mass Index (BMI) 40.0 and over, adult: Secondary | ICD-10-CM | POA: Diagnosis not present

## 2021-06-03 DIAGNOSIS — Z79899 Other long term (current) drug therapy: Secondary | ICD-10-CM | POA: Diagnosis not present

## 2021-06-03 DIAGNOSIS — Z01812 Encounter for preprocedural laboratory examination: Secondary | ICD-10-CM | POA: Insufficient documentation

## 2021-06-03 DIAGNOSIS — G43909 Migraine, unspecified, not intractable, without status migrainosus: Secondary | ICD-10-CM | POA: Diagnosis not present

## 2021-06-03 DIAGNOSIS — Z8249 Family history of ischemic heart disease and other diseases of the circulatory system: Secondary | ICD-10-CM | POA: Insufficient documentation

## 2021-06-03 DIAGNOSIS — Z87891 Personal history of nicotine dependence: Secondary | ICD-10-CM | POA: Diagnosis not present

## 2021-06-03 HISTORY — DX: Headache, unspecified: R51.9

## 2021-06-03 HISTORY — DX: Unspecified osteoarthritis, unspecified site: M19.90

## 2021-06-03 HISTORY — DX: Bronchitis, not specified as acute or chronic: J40

## 2021-06-03 LAB — CBC
HCT: 45.9 % (ref 39.0–52.0)
Hemoglobin: 14.9 g/dL (ref 13.0–17.0)
MCH: 30.2 pg (ref 26.0–34.0)
MCHC: 32.5 g/dL (ref 30.0–36.0)
MCV: 93.1 fL (ref 80.0–100.0)
Platelets: 286 10*3/uL (ref 150–400)
RBC: 4.93 MIL/uL (ref 4.22–5.81)
RDW: 12.7 % (ref 11.5–15.5)
WBC: 10.2 10*3/uL (ref 4.0–10.5)
nRBC: 0 % (ref 0.0–0.2)

## 2021-06-03 NOTE — Progress Notes (Signed)
Elevated BP (147/90) & HR (101) @ PAT appt. Per pt, he has "white coat syndrome" every time he is in a clinical setting. Pt denies CP, SHOB, Dizziness, palpitations. No acute distress noted.

## 2021-06-03 NOTE — Progress Notes (Addendum)
PCP - Maura L. Hamrick, MD @ [facility]; records requested Cardiologist - Candee Furbish, MD Pulmonologist- Larey Days, MD  PPM/ICD - Denies  Chest x-ray - 07/22 @ [facility]; records requested EKG - 07/22 @ [facility]; records requested Stress Test - Per pt, > 10 years ago, results were normal ECHO - 04/18/20 Cardiac Cath - Denies  Sleep Study - Yes, positive for OSA CPAP - Yes, nightly  DM- Denies  Blood Thinner Instructions: N/A Aspirin Instructions: N/A  ERAS Protcol - N/A PRE-SURGERY Ensure or G2- N/A  COVID TEST- N/A; Ambulatory sx   Anesthesia review: Yes, pulmonary hx; review CXR, EKG from Urgent care; review last office note and labs from PCP.  Smoking cessation instruction/counseling given:  counseled patient on the dangers of tobacco use, advised patient to stop smoking, and reviewed strategies to maximize success.  Patient denies shortness of breath, fever, cough and chest pain at PAT appointment  All instructions explained to the patient, with a verbal understanding of the material. Patient agrees to go over the instructions while at home for a better understanding.  The opportunity to ask questions was provided.

## 2021-06-04 ENCOUNTER — Encounter (HOSPITAL_COMMUNITY): Payer: Self-pay

## 2021-06-04 NOTE — Anesthesia Preprocedure Evaluation (Addendum)
Anesthesia Evaluation  Patient identified by MRN, date of birth, ID band Patient awake    Reviewed: Allergy & Precautions, NPO status , Patient's Chart, lab work & pertinent test results  Airway Mallampati: III  TM Distance: >3 FB Neck ROM: Full  Mouth opening: Limited Mouth Opening  Dental no notable dental hx. (+) Teeth Intact, Dental Advisory Given   Pulmonary shortness of breath, sleep apnea , Current SmokerPatient did not abstain from smoking.,    Pulmonary exam normal breath sounds clear to auscultation       Cardiovascular Normal cardiovascular exam Rhythm:Regular Rate:Normal  EKG: Last EKG > 1 year ago. 04/02/20: ST at 103 bpm. Borderline prolonged QT (QT 376 ms, QTc 492 ms)   CV: CT Cardiac scoring 04/18/20: IMPRESSION: Coronary calcium score of 0 Agatston units. This suggests low risk for future cardiac events.  Echo 04/18/20: IMPRESSIONS  1. Left ventricular ejection fraction, by estimation, is 55 to 60%. The  left ventricle has normal function. The left ventricle has no regional  wall motion abnormalities. Left ventricular diastolic parameters are  consistent with Grade I diastolic  dysfunction (impaired relaxation).  2. Right ventricular systolic function is normal. The right ventricular  size is normal. Tricuspid regurgitation signal is inadequate for assessing  PA pressure.  3. The mitral valve is normal in structure. No evidence of mitral valve  regurgitation. No evidence of mitral stenosis.  4. The aortic valve is tricuspid. Aortic valve regurgitation is not  visualized. No aortic stenosis is present.  5. The inferior vena cava is normal in size with greater than 50%  respiratory variability, suggesting right atrial pressure of 3 mmHg.    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 (Surgical clips projected over the occipital bone on c-spine xray 11/15/10 in [place].).     Abdominal   Peds  Hematology negative hematology ROS (+)   Anesthesia Other Findings   Reproductive/Obstetrics                           Anesthesia Physical Anesthesia Plan  ASA: 3  Anesthesia Plan: General   Post-op Pain Management:    Induction: Intravenous  PONV Risk Score and Plan: 1 and Midazolam, Dexamethasone and Ondansetron  Airway Management Planned: Oral ETT and Video Laryngoscope Planned  Additional Equipment:   Intra-op Plan:   Post-operative Plan: Extubation in OR  Informed Consent: I have reviewed the patients History and Physical, chart, labs and discussed the procedure including the risks, benefits and alternatives for the proposed anesthesia with the patient or authorized representative who has indicated his/her understanding and acceptance.     Dental advisory given  Plan Discussed with: CRNA  Anesthesia Plan Comments: (PAT note written 06/04/2021 by Myra Gianotti, PA-C. )       Anesthesia Quick Evaluation

## 2021-06-04 NOTE — Progress Notes (Signed)
Anesthesia Chart Review:  Case: [id number] Date/Time: 06/10/21 0815   Procedure: NASAL SEPTOPLASTY WITH TURBINATE REDUCTION (Bilateral)   Anesthesia type: General   Pre-op diagnosis:      DEVIATED SEPTUM     BILATERAL TURBINATE HYPERTROPHY   Location: [place] OR ROOM 09 / [place] [facility]   Surgeons: Leta Baptist, MD       DISCUSSION: Patient is a 45 year old male scheduled for the above procedure.  History includes smoking, dyspnea, OSA (uses CPAP, but recently having trouble due to chronic nasal obstruction to be addressed by ENT), migraines, edema, abnormal EKG (prolonged QT), COVID-19 (2020), influenza A (02/22/21, with infiltrate on 02/27/21 CXR, report not available). 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  BMI is consistent with morbid obesity.  He had a cardiology evaluation by Dr. Marlou Porch on 04/02/2020 due to abnormal EKG in setting of family history of CAD, obesity, and smoking with feeling of decreased exercise tolerance, dyspnea, lightheadedness, and edema. He was working as a Dealer at the time and did not have any high risk features such unexplained syncope or syncope during exercise. Previously atypical chest discomfort attributed to GERD with "unremarkable" ETT. Also previous prolonged QT interval of EKG. EKG that day showed QTc 492 ms which was felt to be an overestimation. QT 376 ms.  He advised avoiding excessive QT prolonging drugs. Echocardiogram ordered to ensure proper structure and function of his heart especially given symptoms of shortness of breath which was felt likely multifactorial given his weight and smoking. CT coronary calcium score also ordered. Echo was reassuring,  and Coronary calcium score was 0, however, chest imaging showed LUL 3 mm pulmonary nodule. Per Dr. Marlou Porch, "Calcium score is 0, no evidence of coronary calcification.  Excellent.  Low risk. Left upper lobe 3 mm pulmonary nodule.  Given current smoking status, recommend repeating noncontrast chest CT in 12 months.  Continue to encourage tobacco cessation.  Please order CT of chest for 1 year." It does not appear that he has had to repeat chest CT as of yet.   Pulmonology visit with Dr. Silas Flood 03/24/21. Patient was recovering for influenza A with associated PNA treated out-patient [facility]) with azithromycin, Augmentin, prednisone taper and as needed albuterol.  DOE felt multifactorial.  By notes "prior spirometry suggestive of moderate restriction versus gas trapping.  Given his habitus, suspect element of restriction from body wall/chest wall height and interference."  40 pound weight gain since 2019, smoking, worsening seasonal allergies with nasal polyps also felt to be contributing.  Mid dose Advair recommended.  Given recent flu, plan to postpone PFTs until 3 month follow-up. Patient not willing to quit smoking at that time.  He thought he had an EKG at [facility], but none received. He does have a history of at least borderline QT interval, so would anticipate need for updated EKG on the day of surgery. Avoiding "excessive QT prolonging drugs" recommended by cardiology last year. He had reassuring cardiac testing in 2021. He is now on Advair and albuterol as needed. He is hoping this surgery will allow him to get back to consistent use of his CPAP.   Anesthesia team to evaluate on the  day of surgery.  VS: BP (!) 147/90   Pulse (!) 101   Temp 36.6 C (Oral)   Resp 19   Ht 6\' 5"  (1.956 m)   Wt (!) 160.1 kg   SpO2 97%   BMI 41.86 kg/m   PROVIDERS: Hamrick, Lorin Mercy, MD is PCP  Larey Days, MD is pulmonologist Candee Furbish, MD is cardiologist   Etna Cellar, MD is GI   LABS: Labs reviewed: Acceptable for surgery. (all labs ordered are listed, but only abnormal results are displayed)  Labs Reviewed  CBC     IMAGES: CT chest 04/18/20: IMPRESSION: 1.  No acute findings in the imaged extracardiac chest. 2. Left upper lobe 3 mm pulmonary nodule. No follow-up needed if patient is low-risk. Non-contrast chest CT can be considered in 12 months if patient is high-risk. This recommendation follows the consensus statement: Guidelines for Management of Incidental Pulmonary Nodules Detected on CT Images: From the Fleischner Society 2017; Radiology 2017; 284:228-243.  Xray C-spine 11/15/10 ([facility]): FINDINGS: Surgical clips projected over the occipital bone. There is no  subluxation or acute fracture. The height of the vertebral bodies is  normal. Disc spaces are maintained. Neural foramina are patent. The  prevertebral soft tissues are normal in thickness. The odontoid is  obscured by overlying osseous structures.  IMPRESSION: No acute finding seen. Odontoid obscured.    EKG: Last EKG > 1 year ago. 04/02/20: ST at 103 bpm. Borderline prolonged QT (QT 376 ms, QTc 492 ms)   CV: CT Cardiac scoring 04/18/20: IMPRESSION: Coronary calcium score of 0 Agatston units. This suggests low risk for future cardiac events.  Echo 04/18/20: IMPRESSIONS   1. Left ventricular ejection fraction, by estimation, is 55 to 60%. The  left ventricle has normal function. The left ventricle has no regional  wall motion abnormalities. Left ventricular diastolic parameters are  consistent with Grade I diastolic  dysfunction (impaired relaxation).   2. Right ventricular systolic function is normal. The right ventricular  size is normal. Tricuspid regurgitation signal is inadequate for assessing  PA pressure.   3. The mitral valve is normal in structure. No evidence of mitral valve  regurgitation. No evidence of mitral stenosis.   4. The  aortic valve is tricuspid. Aortic valve regurgitation is not  visualized. No aortic stenosis is present.   5. The inferior vena cava is normal in size with greater than 50%  respiratory variability, suggesting right atrial pressure of 3 mmHg.    Past Medical History:  Diagnosis Date   Abnormal EKG    prolonged/borderline prolonged QT   Arthritis    Bronchitis    Dyspnea    Edema    Fatigue    Headache    migraines   History of colon polyps    Lightheaded    Morbid obesity (HCC)    Pneumonia    Sleep apnea    uses cpap   Tobacco abuse    Weight gain     Past Surgical History:  Procedure Laterality Date   COLONOSCOPY  08/08/2020   [facility]   COLONOSCOPY  2001   [facility], 5 polyps   NECK SURGERY     "12 steel sutsures in his posterior neck" following traumatic breech delivery   POLYPECTOMY      MEDICATIONS:  acetaminophen (TYLENOL) 500 MG tablet   albuterol (VENTOLIN HFA) 108 (90 Base) MCG/ACT inhaler   fluticasone-salmeterol (ADVAIR) 250-50 MCG/ACT AEPB   No current facility-administered medications for this encounter.  Myra Gianotti, PA-C Surgical Short Stay/Anesthesiology [facility] Phone [phone] [facility] Phone [phone] 06/04/2021 5:59 PM

## 2021-06-10 ENCOUNTER — Ambulatory Visit (HOSPITAL_COMMUNITY): Payer: 59 | Admitting: Anesthesiology

## 2021-06-10 ENCOUNTER — Ambulatory Visit (HOSPITAL_COMMUNITY)
Admission: RE | Admit: 2021-06-10 | Discharge: 2021-06-10 | Disposition: A | Discharge status: Home / Self Care | Payer: 59 | Attending: Otolaryngology | Admitting: Otolaryngology

## 2021-06-10 ENCOUNTER — Encounter (HOSPITAL_COMMUNITY)
Admission: RE | Disposition: A | Discharge status: Home / Self Care | Payer: Self-pay | Source: Home / Self Care | Attending: Otolaryngology

## 2021-06-10 ENCOUNTER — Encounter (HOSPITAL_COMMUNITY): Payer: Self-pay | Admitting: Otolaryngology

## 2021-06-10 ENCOUNTER — Other Ambulatory Visit: Payer: Self-pay

## 2021-06-10 ENCOUNTER — Ambulatory Visit (HOSPITAL_COMMUNITY): Payer: 59 | Admitting: Vascular Surgery

## 2021-06-10 DIAGNOSIS — H6982 Other specified disorders of Eustachian tube, left ear: Secondary | ICD-10-CM | POA: Diagnosis not present

## 2021-06-10 DIAGNOSIS — Z6841 Body Mass Index (BMI) 40.0 and over, adult: Secondary | ICD-10-CM | POA: Diagnosis not present

## 2021-06-10 DIAGNOSIS — G4733 Obstructive sleep apnea (adult) (pediatric): Secondary | ICD-10-CM | POA: Diagnosis not present

## 2021-06-10 DIAGNOSIS — J343 Hypertrophy of nasal turbinates: Secondary | ICD-10-CM | POA: Insufficient documentation

## 2021-06-10 DIAGNOSIS — J310 Chronic rhinitis: Secondary | ICD-10-CM | POA: Diagnosis not present

## 2021-06-10 DIAGNOSIS — J342 Deviated nasal septum: Secondary | ICD-10-CM | POA: Diagnosis not present

## 2021-06-10 DIAGNOSIS — F17200 Nicotine dependence, unspecified, uncomplicated: Secondary | ICD-10-CM | POA: Diagnosis not present

## 2021-06-10 HISTORY — PX: NASAL SEPTOPLASTY W/ TURBINOPLASTY: SHX2070

## 2021-06-10 HISTORY — PX: MYRINGOTOMY WITH TUBE PLACEMENT: SHX5663

## 2021-06-10 SURGERY — SEPTOPLASTY, NOSE, WITH NASAL TURBINATE REDUCTION
Anesthesia: General | Laterality: Left

## 2021-06-10 MED ORDER — LIDOCAINE-EPINEPHRINE 1 %-1:100000 IJ SOLN
INTRAMUSCULAR | Status: AC
  Filled 2021-06-10: qty 1

## 2021-06-10 MED ORDER — ACETAMINOPHEN 500 MG PO TABS
1000.0000 mg | ORAL_TABLET | Freq: Once | ORAL | Status: AC
  Administered 2021-06-10: 1000 mg via ORAL
  Filled 2021-06-10: qty 2

## 2021-06-10 MED ORDER — BACITRACIN ZINC 500 UNIT/GM EX OINT
TOPICAL_OINTMENT | CUTANEOUS | Status: DC | PRN
  Administered 2021-06-10: 1 via TOPICAL

## 2021-06-10 MED ORDER — BACITRACIN ZINC 500 UNIT/GM EX OINT
TOPICAL_OINTMENT | CUTANEOUS | Status: AC
  Filled 2021-06-10: qty 28.35

## 2021-06-10 MED ORDER — FENTANYL CITRATE (PF) 100 MCG/2ML IJ SOLN
25.0000 ug | INTRAMUSCULAR | Status: DC | PRN
  Administered 2021-06-10 (×3): 25 ug via INTRAVENOUS

## 2021-06-10 MED ORDER — ORAL CARE MOUTH RINSE: 15.0000 mL | Freq: Once | OROMUCOSAL | Status: AC

## 2021-06-10 MED ORDER — PROPOFOL 10 MG/ML IV BOLUS
INTRAVENOUS | Status: AC
  Filled 2021-06-10: qty 40

## 2021-06-10 MED ORDER — MIDAZOLAM HCL 5 MG/5ML IJ SOLN
INTRAMUSCULAR | Status: DC | PRN
  Administered 2021-06-10: 2 mg via INTRAVENOUS

## 2021-06-10 MED ORDER — FENTANYL CITRATE (PF) 100 MCG/2ML IJ SOLN
INTRAMUSCULAR | Status: DC | PRN
  Administered 2021-06-10 (×3): 50 ug via INTRAVENOUS

## 2021-06-10 MED ORDER — LIDOCAINE 2% (20 MG/ML) 5 ML SYRINGE
INTRAMUSCULAR | Status: DC | PRN
  Administered 2021-06-10: 100 mg via INTRAVENOUS

## 2021-06-10 MED ORDER — ROCURONIUM BROMIDE 10 MG/ML (PF) SYRINGE
PREFILLED_SYRINGE | INTRAVENOUS | Status: DC | PRN
  Administered 2021-06-10: 10 mg via INTRAVENOUS

## 2021-06-10 MED ORDER — ONDANSETRON HCL 4 MG/2ML IJ SOLN
INTRAMUSCULAR | Status: AC
  Filled 2021-06-10: qty 2

## 2021-06-10 MED ORDER — FENTANYL CITRATE (PF) 250 MCG/5ML IJ SOLN
INTRAMUSCULAR | Status: AC
  Filled 2021-06-10: qty 5

## 2021-06-10 MED ORDER — LIDOCAINE-EPINEPHRINE 1 %-1:100000 IJ SOLN
INTRAMUSCULAR | Status: DC | PRN
  Administered 2021-06-10: 4 mL

## 2021-06-10 MED ORDER — MIDAZOLAM HCL 2 MG/2ML IJ SOLN
INTRAMUSCULAR | Status: AC
  Filled 2021-06-10: qty 2

## 2021-06-10 MED ORDER — ALBUTEROL SULFATE HFA 108 (90 BASE) MCG/ACT IN AERS
INHALATION_SPRAY | RESPIRATORY_TRACT | Status: DC | PRN
  Administered 2021-06-10: 2 via RESPIRATORY_TRACT

## 2021-06-10 MED ORDER — AMOXICILLIN 875 MG PO TABS: 875.0000 mg | ORAL_TABLET | Freq: Two times a day (BID) | ORAL | 0 refills | Status: AC

## 2021-06-10 MED ORDER — DEXAMETHASONE SODIUM PHOSPHATE 10 MG/ML IJ SOLN
INTRAMUSCULAR | Status: DC | PRN
  Administered 2021-06-10: 4 mg via INTRAVENOUS

## 2021-06-10 MED ORDER — CIPROFLOXACIN-DEXAMETHASONE 0.3-0.1 % OT SUSP
OTIC | Status: AC
  Filled 2021-06-10: qty 7.5

## 2021-06-10 MED ORDER — OXYCODONE-ACETAMINOPHEN 5-325 MG PO TABS: 2.0000 | ORAL_TABLET | ORAL | 0 refills | Status: AC | PRN

## 2021-06-10 MED ORDER — GLYCOPYRROLATE PF 0.2 MG/ML IJ SOSY
PREFILLED_SYRINGE | INTRAMUSCULAR | Status: AC
  Filled 2021-06-10: qty 1

## 2021-06-10 MED ORDER — DEXAMETHASONE SODIUM PHOSPHATE 10 MG/ML IJ SOLN
INTRAMUSCULAR | Status: AC
  Filled 2021-06-10: qty 1

## 2021-06-10 MED ORDER — OXYMETAZOLINE HCL 0.05 % NA SOLN
NASAL | Status: AC
  Filled 2021-06-10: qty 30

## 2021-06-10 MED ORDER — PROPOFOL 10 MG/ML IV BOLUS
INTRAVENOUS | Status: DC | PRN
Start: 2021-06-10 — End: 2021-06-10
  Administered 2021-06-10: 100 mg via INTRAVENOUS
  Administered 2021-06-10: 200 mg via INTRAVENOUS

## 2021-06-10 MED ORDER — LACTATED RINGERS IV SOLN: INTRAVENOUS | Status: DC

## 2021-06-10 MED ORDER — DEXTROSE 5 % IV SOLN
INTRAVENOUS | Status: DC | PRN
  Administered 2021-06-10: 3 g via INTRAVENOUS

## 2021-06-10 MED ORDER — PHENYLEPHRINE 40 MCG/ML (10ML) SYRINGE FOR IV PUSH (FOR BLOOD PRESSURE SUPPORT)
PREFILLED_SYRINGE | INTRAVENOUS | Status: DC | PRN
  Administered 2021-06-10 (×2): 80 ug via INTRAVENOUS

## 2021-06-10 MED ORDER — SUCCINYLCHOLINE CHLORIDE 200 MG/10ML IV SOSY
PREFILLED_SYRINGE | INTRAVENOUS | Status: DC | PRN
  Administered 2021-06-10: 200 mg via INTRAVENOUS

## 2021-06-10 MED ORDER — CHLORHEXIDINE GLUCONATE 0.12 % MT SOLN
15.0000 mL | Freq: Once | OROMUCOSAL | Status: AC
  Administered 2021-06-10: 15 mL via OROMUCOSAL
  Filled 2021-06-10: qty 15

## 2021-06-10 MED ORDER — GLYCOPYRROLATE 0.2 MG/ML IJ SOLN
INTRAMUSCULAR | Status: DC | PRN
  Administered 2021-06-10 (×2): .1 mg via INTRAVENOUS

## 2021-06-10 MED ORDER — ONDANSETRON HCL 4 MG/2ML IJ SOLN
INTRAMUSCULAR | Status: DC | PRN
  Administered 2021-06-10: 4 mg via INTRAVENOUS

## 2021-06-10 MED ORDER — OXYMETAZOLINE HCL 0.05 % NA SOLN
NASAL | Status: DC | PRN
  Administered 2021-06-10: 1

## 2021-06-10 MED ORDER — FENTANYL CITRATE (PF) 100 MCG/2ML IJ SOLN
INTRAMUSCULAR | Status: AC
  Filled 2021-06-10: qty 2

## 2021-06-10 MED ORDER — 0.9 % SODIUM CHLORIDE (POUR BTL) OPTIME
TOPICAL | Status: DC | PRN
  Administered 2021-06-10: 1000 mL

## 2021-06-10 MED ORDER — SUCCINYLCHOLINE CHLORIDE 200 MG/10ML IV SOSY
PREFILLED_SYRINGE | INTRAVENOUS | Status: AC
  Filled 2021-06-10: qty 10

## 2021-06-10 MED ORDER — DEXMEDETOMIDINE (PRECEDEX) IN NS 20 MCG/5ML (4 MCG/ML) IV SYRINGE
PREFILLED_SYRINGE | INTRAVENOUS | Status: DC | PRN
  Administered 2021-06-10 (×2): 8 ug via INTRAVENOUS

## 2021-06-10 SURGICAL SUPPLY — 29 items
BAG COUNTER SPONGE SURGICOUNT (BAG) ×3 IMPLANT
BAG SPNG CNTER NS LX DISP (BAG) ×2
BLADE MYRINGOTOMY 6 SPEAR HDL (BLADE) ×1 IMPLANT
CANISTER SUCT 3000ML PPV (MISCELLANEOUS) ×3 IMPLANT
COAGULATOR SUCT SWTCH 10FR 6 (ELECTROSURGICAL) ×3 IMPLANT
COVER SURGICAL LIGHT HANDLE (MISCELLANEOUS) ×2 IMPLANT
DRAPE HALF SHEET 40X57 (DRAPES) ×1 IMPLANT
ELECT REM PT RETURN 9FT ADLT (ELECTROSURGICAL) ×3
ELECTRODE REM PT RTRN 9FT ADLT (ELECTROSURGICAL) ×2 IMPLANT
GAUZE SPONGE 2X2 8PLY STRL LF (GAUZE/BANDAGES/DRESSINGS) ×1 IMPLANT
GLOVE SURG LTX SZ7.5 (GLOVE) ×3 IMPLANT
GOWN STRL REUS W/ TWL LRG LVL3 (GOWN DISPOSABLE) ×4 IMPLANT
GOWN STRL REUS W/TWL LRG LVL3 (GOWN DISPOSABLE) ×6
KIT BASIN OR (CUSTOM PROCEDURE TRAY) ×3 IMPLANT
KIT TURNOVER KIT B (KITS) ×3 IMPLANT
NDL HYPO 25GX1X1/2 BEV (NEEDLE) ×2 IMPLANT
NEEDLE HYPO 25GX1X1/2 BEV (NEEDLE) ×3 IMPLANT
NS IRRIG 1000ML POUR BTL (IV SOLUTION) ×3 IMPLANT
PAD ARMBOARD 7.5X6 YLW CONV (MISCELLANEOUS) ×6 IMPLANT
SPLINT NASAL DOYLE BI-VL (GAUZE/BANDAGES/DRESSINGS) ×3 IMPLANT
SPONGE GAUZE 2X2 STER 10/PKG (GAUZE/BANDAGES/DRESSINGS)
SPONGE NEURO XRAY DETECT 1X3 (DISPOSABLE) ×3 IMPLANT
SUT CHROMIC 4 0 SH 27 (SUTURE) ×3 IMPLANT
SUT PLAIN 4 0 ~~LOC~~ 1 (SUTURE) ×3 IMPLANT
SUT PROLENE 3 0 PS 2 (SUTURE) ×3 IMPLANT
TRAY ENT MC OR (CUSTOM PROCEDURE TRAY) ×3 IMPLANT
TUBE EAR SHEEHY BUTTON 1.27 (OTOLOGIC RELATED) ×1 IMPLANT
TUBE SALEM SUMP 16 FR W/ARV (TUBING) ×1 IMPLANT
TUBING EXTENTION W/L.L. (IV SETS) ×1 IMPLANT

## 2021-06-10 NOTE — Anesthesia Procedure Notes (Signed)
Procedure Name: Intubation Date/Time: 06/10/2021 8:32 AM Performed by: Geraldine Contras, CRNA Pre-anesthesia Checklist: Patient identified, Patient being monitored, Timeout performed, Emergency Drugs available and Suction available Patient Re-evaluated:Patient Re-evaluated prior to induction Oxygen Delivery Method: Circle system utilized Preoxygenation: Pre-oxygenation with 100% oxygen Induction Type: IV induction Ventilation: Mask ventilation without difficulty Laryngoscope Size: Glidescope and 4 Grade View: Grade I Tube type: Oral Tube size: 8.0 mm Number of attempts: 1 Airway Equipment and Method: Stylet Placement Confirmation: ETT inserted through vocal cords under direct vision, positive ETCO2 and breath sounds checked- equal and bilateral Secured at: 22 cm Tube secured with: Tape Dental Injury: Teeth and Oropharynx as per pre-operative assessment

## 2021-06-10 NOTE — Op Note (Signed)
DATE OF PROCEDURE: 06/10/2021  OPERATIVE REPORT   SURGEON: Leta Baptist, MD   PREOPERATIVE DIAGNOSES:  1. Severe nasal septal deviation.  2. Bilateral inferior turbinate hypertrophy.  3. Chronic nasal obstruction. 4. Left Eustachian tube dysfunction. 5. Left middle ear effusion.  POSTOPERATIVE DIAGNOSES:  1. Severe nasal septal deviation.  2. Bilateral inferior turbinate hypertrophy.  3. Chronic nasal obstruction. 4. Left Eustachian tube dysfunction. 5. Left middle ear effusion.  PROCEDURE PERFORMED:  1. Septoplasty.  2. Bilateral partial inferior turbinate resection.  3. Left myringotomy and tube placement.  ANESTHESIA: General endotracheal tube anesthesia.   COMPLICATIONS: None.   ESTIMATED BLOOD LOSS: 30 mL.   INDICATION FOR PROCEDURE: Jorge RUDDEN Sr. is a 45 y.o. male with a history of chronic nasal obstruction, eustachian tube dysfunction and hearing loss.  At his previous visits, he was noted to have nasal septal deviation and bilateral inferior turbinate hypertrophy, resulting in significant (>95%) nasal obstruction.  The patient was treated with allergy medications and daily nasal steroid nasal spray. He continued to have chronic nasal obstruction. In addition, he was also noted to have left eustachian tube dysfunction.  His left middle ear effusion persisted despite medical treatment, and has noted progressive worsening of his left ear hearing.  He has a constant fluid sensation in the left ear. Based on the above findings, the decision was made for the patient to undergo the above-stated procedures. The risks, benefits, alternatives, and details of the procedures were discussed with the patient. Questions were invited and answered. Informed consent was obtained.   DESCRIPTION OF PROCEDURE: The patient was taken to the operating room and placed supine on the operating table. General endotracheal tube anesthesia was administered by the anesthesiologist. The patient was  positioned, and prepped and draped in the standard fashion for nasal surgery. Pledgets soaked with Afrin were placed in both nasal cavities for decongestion. The pledgets were subsequently removed.   Under the operating microscope, the left ear canal was cleaned of all cerumen.  The tympanic membrane was noted to be intact but mildly retracted.  A standard myringotomy incision was made at the anterior-inferior quadrant on the tympanic membrane.  A moderate amount of serous fluid was suctioned from behind the tympanic membrane. A Sheehy collar button tube was placed, followed by antibiotic eardrops in the ear canal.   Examination of the nasal cavity revealed a severe nasal septal deviation. 1% lidocaine with 1:100,000 epinephrine was injected onto the nasal septum bilaterally. A hemitransfixion incision was made on the left side. The mucosal flap was carefully elevated on the left side. A cartilaginous incision was made 1 cm superior to the caudal margin of the nasal septum. Mucosal flap was also elevated on the right side in the similar fashion. It should be noted that due to the severe septal deviation, the deviated portion of the cartilaginous and bony septum had to be removed in piecemeal fashion. Once the deviated portions were removed, a straight midline septum was achieved. The septum was then quilted with 4-0 plain gut sutures. The hemitransfixion incision was closed with interrupted 4-0 chromic sutures.   The inferior one half of both hypertrophied inferior turbinate was crossclamped with a Kelly clamp. The inferior one half of each inferior turbinate was then resected with a pair of cross cutting scissors. Hemostasis was achieved with a suction cautery device. Doyle splints were applied to the nasal septum.  The care of the patient was turned over to the anesthesiologist. The patient was awakened from anesthesia  without difficulty. The patient was extubated and transferred to the recovery room in  good condition.   OPERATIVE FINDINGS: Severe nasal septal deviation and bilateral inferior turbinate hypertrophy. Left middle ear effusion.  SPECIMEN: None.   FOLLOWUP CARE: The patient be discharged home once he is awake and alert. The patient will be placed on Percocet p.r.n. pain, and amoxicillin 875 mg p.o. b.i.d. for 3 days. The patient will follow up in my office in 2 days for splint removal.   Uma Jerde Raynelle Bring, MD  06/10/2021

## 2021-06-10 NOTE — H&P (Signed)
Cc: Chronic nasal obstruction  HPI: The patient is a 45 year old male who returns today for his follow-up evaluation. The patient was previously seen for chronic nasal obstruction, eustachian tube dysfunction and hearing loss.  At his last visit in 11/2020, he was noted to have nasal septal deviation and bilateral inferior turbinate hypertrophy, resulting in significant nasal obstruction.  The patient was continued on his daily nasal steroid nasal spray.  The options of surgical intervention were also discussed.  In addition, he was also noted to have left eustachian tube dysfunction.  His left middle ear effusion had improved at that time.  The patient returns today complaining of worsening of his symptoms.  He has noted progressive worsening of his left ear hearing.  He has a constant fluid sensation in the left ear.  He also reports worsening of his breathing.  He is not a habitual mouth breather.  The patient also has a history of obstructive sleep apnea.  He is using a CPAP machine at night. No other ENT, GI, or respiratory issue noted since the last visit.   Exam: General: Communicates without difficulty, well nourished, no acute distress. Head: Normocephalic, no evidence injury, no tenderness, facial buttresses intact without stepoff. Eyes: PERRL, EOMI. No scleral icterus, conjunctivae clear. Neuro: CN II exam reveals vision grossly intact. No nystagmus at any point of gaze. Ears: Auricles well formed without lesions. Ear canals are intact without mass or lesion. No erythema or edema is appreciated. The TMs are intact with left middle ear effusion. Nose: External evaluation reveals normal support and skin without lesions. Dorsum is intact. Anterior rhinoscopy reveals congested and edematous mucosa over anterior aspect of the inferior turbinates and nasal septum. No purulence is noted. Middle meatus is not well visualized. Oral:  Oral cavity and oropharynx are intact, symmetric, without erythema or  edema. Mucosa is moist without lesions. Neck: Full range of motion without pain. There is no significant lymphadenopathy. No masses palpable. Thyroid bed within normal limits to palpation. Parotid glands and submandibular glands equal bilaterally without mass. Trachea is midline. Neuro:  CN 2-12 grossly intact. Gait normal. Vestibular: No nystagmus at any point of gaze. A flexible scope was inserted into the right nasal cavity. NSD noted. Endoscopy of the interior nasal cavity, superior, inferior, and middle meatus was performed. The sphenoid-ethmoid recess was examined. Edematous mucosa was noted. No polyp, mass, or lesion was appreciated. Olfactory cleft was clear. Nasopharynx was clear. Turbinates were hypertrophied but without mass.  The procedure was repeated on the contralateral side with similar findings.   Assessment  1.  Chronic rhinitis with nasal mucosal congestion, severe nasal septal deviation, and bilateral inferior turbinate hypertrophy. More than 95% of his nasal passageways are obstructed bilaterally.  2.  Left ear eustachian tube dysfunction.   3.  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   Plan  1.  The physical exam and nasal endoscopy findings are reviewed with the patient.  2.  The patient should continue with Flonase nasal spray 2 sprays each nostril daily.  3.  Prednisone dosepak for 6 days.  4.  Daily Valsalva exercise.  5.  Based on the above findings, the patient will benefit from undergoing septoplasty and bilateral turbinate reduction to improve his nasal passageways.  If his middle ear effusion persists, he may also benefit from left myringotomy and tube placement.  6.  The risks, benefits, alternatives and details of the procedure are reviewed with the patient.  Questions are invited and answered.  7.  The patient would like to proceed with the procedures.

## 2021-06-10 NOTE — Transfer of Care (Signed)
Immediate Anesthesia Transfer of Care Note  Patient: Jorge Meeker Sr.  Procedure(s) Performed: NASAL SEPTOPLASTY WITH TURBINATE REDUCTION (Bilateral) MYRINGOTOMY WITH TUBE PLACEMENT (Left)  Patient Location: PACU  Anesthesia Type:General  Level of Consciousness: awake  Airway & Oxygen Therapy: Patient Spontanous Breathing  Post-op Assessment: Report given to RN  Post vital signs: stable  Last Vitals:  Vitals Value Taken Time  BP 126/85 06/10/21 0938  Temp    Pulse 93 06/10/21 0941  Resp 15 06/10/21 0941  SpO2 98 % 06/10/21 0941  Vitals shown include unvalidated device data.  Last Pain:  Vitals:   06/10/21 0700  TempSrc: Oral  PainSc:       Patients Stated Pain Goal: 3 (11/24/20 4825)  Complications: No notable events documented.

## 2021-06-10 NOTE — Anesthesia Postprocedure Evaluation (Signed)
Anesthesia Post Note  Patient: Jorge Meeker Sr.  Procedure(s) Performed: NASAL SEPTOPLASTY WITH TURBINATE REDUCTION (Bilateral) MYRINGOTOMY WITH TUBE PLACEMENT (Left)     Patient location during evaluation: PACU Anesthesia Type: General Level of consciousness: awake and alert Pain management: pain level controlled Vital Signs Assessment: post-procedure vital signs reviewed and stable Respiratory status: spontaneous breathing, nonlabored ventilation, respiratory function stable and patient connected to nasal cannula oxygen Cardiovascular status: blood pressure returned to baseline and stable Postop Assessment: no apparent nausea or vomiting Anesthetic complications: no   No notable events documented.  Last Vitals:  Vitals:   06/10/21 1010 06/10/21 1020  BP: (!) 141/96 (!) 141/96  Pulse: 95 98  Resp: 12 11  Temp:  36.7 C  SpO2: 93% 94%    Last Pain:  Vitals:   06/10/21 1020  TempSrc:   PainSc: 3                  Ahja Martello L Sameer Teeple

## 2021-06-10 NOTE — Discharge Instructions (Signed)

## 2021-06-11 ENCOUNTER — Encounter (HOSPITAL_COMMUNITY): Payer: Self-pay | Admitting: Otolaryngology

## 2021-07-13 ENCOUNTER — Other Ambulatory Visit: Payer: Self-pay | Admitting: Pulmonary Disease

## 2021-07-13 LAB — SARS CORONAVIRUS 2 (TAT 6-24 HRS): SARS Coronavirus 2: NEGATIVE

## 2021-07-17 ENCOUNTER — Ambulatory Visit (INDEPENDENT_AMBULATORY_CARE_PROVIDER_SITE_OTHER): Payer: 59 | Admitting: Pulmonary Disease

## 2021-07-17 ENCOUNTER — Other Ambulatory Visit: Payer: Self-pay

## 2021-07-17 ENCOUNTER — Encounter: Payer: Self-pay | Admitting: Pulmonary Disease

## 2021-07-17 VITALS — BP 142/86 | HR 117 | Temp 98.7°F | Ht 74.5 in | Wt 352.8 lb

## 2021-07-17 DIAGNOSIS — R0609 Other forms of dyspnea: Secondary | ICD-10-CM

## 2021-07-17 LAB — PULMONARY FUNCTION TEST
DL/VA % pred: 136 %
DL/VA: 6.1 ml/min/mmHg/L
DLCO cor % pred: 79 %
DLCO cor: 27.62 ml/min/mmHg
DLCO unc % pred: 79 %
DLCO unc: 27.62 ml/min/mmHg
FEF 25-75 Post: 2.92 L/sec
FEF 25-75 Pre: 2.1 L/sec
FEF2575-%Change-Post: 38 %
FEF2575-%Pred-Post: 69 %
FEF2575-%Pred-Pre: 49 %
FEV1-%Change-Post: 7 %
FEV1-%Pred-Post: 62 %
FEV1-%Pred-Pre: 58 %
FEV1-Post: 2.94 L
FEV1-Pre: 2.72 L
FEV1FVC-%Change-Post: 4 %
FEV1FVC-%Pred-Pre: 93 %
FEV6-%Change-Post: 3 %
FEV6-%Pred-Post: 64 %
FEV6-%Pred-Pre: 62 %
FEV6-Post: 3.78 L
FEV6-Pre: 3.67 L
FEV6FVC-%Change-Post: 0 %
FEV6FVC-%Pred-Post: 102 %
FEV6FVC-%Pred-Pre: 102 %
FVC-%Change-Post: 2 %
FVC-%Pred-Post: 63 %
FVC-%Pred-Pre: 61 %
FVC-Post: 3.79 L
FVC-Pre: 3.69 L
Post FEV1/FVC ratio: 77 %
Post FEV6/FVC ratio: 100 %
Pre FEV1/FVC ratio: 74 %
Pre FEV6/FVC Ratio: 99 %
RV % pred: 87 %
RV: 1.89 L
TLC % pred: 67 %
TLC: 5.27 L

## 2021-07-17 NOTE — Patient Instructions (Signed)
Nice to see you again  It is okay to stay off the Advair inhaler  I am glad the breathing is better after your nose surgery  Lets give it some time and see if it continues to improve  Return to clinic in 6 months or sooner as needed with Dr. Silas Flood

## 2021-07-17 NOTE — Progress Notes (Signed)
PFT done today. 

## 2021-11-09 NOTE — Progress Notes (Signed)
$'@Patient'D$  ID: Jorge Meeker Sr., male    DOB: Jan 08, 1976, 46 y.o.   MRN: [id number]  Chief Complaint  Patient presents with   Follow-up    PFT    Referring provider: HamrickLorin Mercy, MD  HPI:   46 year old whom we are seeing in follow up for evaluation of dyspnea on exertion.    Returned for routine f/u. Had ENT surgery on nose. Cleared nasal passages. Feels breathing much improved. Used Advair some. Helped a bit. Stopped after ENT surgery as felt breathing so much improved s/p surgery. Reviewed op note 06/10/21 - septoplasty, turbinate resection, ear tube placement. Reviewed PFTs performed today interpreted as spirometry suggestive of moderate restriction vs gas trapping, mild restriction on lung volumes, DLCO WNL - suggestive of extra-parenchymal restriction, obesity.   HPI at initial visit: Patient is dyspnea on exertion over the last 2 or 3 years.  Worse with inclines or stairs.  Can occur on flat surfaces as well.  Relatively stable over the last 2 or 3 years.  He worse over the last year or so.  Associated worsening symptoms include worsening nasal congestion, seasonal allergies.  Diagnosed with nasal polyps by ENT March 2022.  No other environmental factors he can identify that make things better or worse.  No positional things better or worse.  No time of day when things are better or worse.  Has not tried any medication or anything to help with this.  He notes that about 3 weeks ago he was diagnosed with flu and told he had pneumonia based on chest x-ray.  He does not know the exact results of the chest x-ray.  He was given antibiotics.  Albuterol inhaler at that time.  It did help the cough associated with his acute illness.  Otherwise never used inhalers.  Most recent chest imaging 04/2020 CT coronary reviewed with what I believed to be subtle mild emphysematous changes that can be seen in the middle portion of the lungs peripherally, otherwise clear lungs.  PMH: Seasonal  allergies, recurrent pneumonias, nasal polyps Surgical history: Reviewed with patient, denies any Family history: CAD in father Social history: Current smoker, 1 pack/day for many years, lives in [place] city-grew up there, works as Higher education careers adviser / Pulmonary Flowsheets:   ACT:  No flowsheet data found.  MMRC: No flowsheet data found.  Epworth:  No flowsheet data found.  Tests:   FENO:  No results found for: NITRICOXIDE  PFT: PFT Results Latest Ref Rng & Units 07/17/2021  FVC-Pre L 3.69  FVC-Predicted Pre % 61  FVC-Post L 3.79  FVC-Predicted Post % 63  Pre FEV1/FVC % % 74  Post FEV1/FCV % % 77  FEV1-Pre L 2.72  FEV1-Predicted Pre % 58  FEV1-Post L 2.94  DLCO uncorrected ml/min/mmHg 27.62  DLCO UNC% % 79  DLCO corrected ml/min/mmHg 27.62  DLCO COR %Predicted % 79  DLVA Predicted % 136  TLC L 5.27  TLC % Predicted % 67  RV % Predicted % 87  Reviewed and  interpreted as spirometry suggestive of moderate restriction vs gas trapping, mild restriction on lung volumes, DLCO WNL - suggestive of extra-parenchymal restriction, obesity  WALK:  No flowsheet data found.  Imaging: Personally reviewed as per EMR discussion this note  Lab Results: Personally reviewed CBC    Component Value Date/Time   WBC 10.2 06/03/2021 0900   RBC 4.93 06/03/2021 0900   HGB 14.9 06/03/2021 0900   HCT 45.9 06/03/2021 0900   PLT 286 06/03/2021  0900   MCV 93.1 06/03/2021 0900   MCH 30.2 06/03/2021 0900   MCHC 32.5 06/03/2021 0900   RDW 12.7 06/03/2021 0900    BMET No results found for: NA, K, CL, CO2, GLUCOSE, BUN, CREATININE, CALCIUM, GFRNONAA, GFRAA  BNP No results found for: BNP  ProBNP No results found for: PROBNP  Specialty Problems       Pulmonary Problems   OSA (obstructive sleep apnea)    Uses CPAP as needed       Allergies  Allergen Reactions   Sulfa Antibiotics Shortness Of Breath and Swelling    Immunization History  Administered Date(s)  Administered   Tdap 09/06/2009, 03/05/2021    Past Medical History:  Diagnosis Date   Abnormal EKG    prolonged/borderline prolonged QT   Arthritis    Bronchitis    Dyspnea    Edema    Fatigue    Headache    migraines   History of colon polyps    Lightheaded    Morbid obesity (HCC)    Pneumonia    Sleep apnea    uses cpap   Tobacco abuse    Weight gain     Tobacco History: Social History   Tobacco Use  Smoking Status Every Day   Packs/day: 1.00   Types: Cigarettes   Start date: 10/11/1991  Smokeless Tobacco Never   Ready to quit: Not Answered Counseling given: Not Answered   Continue to not smoke  Outpatient Encounter Medications as of 07/17/2021  Medication Sig   acetaminophen (TYLENOL) 500 MG tablet Take 1,000-2,000 mg by mouth daily as needed for moderate pain. (Patient not taking: Reported on 07/17/2021)   albuterol (VENTOLIN HFA) 108 (90 Base) MCG/ACT inhaler Inhale 2 puffs into the lungs every 6 (six) hours as needed for wheezing or shortness of breath. (Patient not taking: Reported on 07/17/2021)   fluticasone-salmeterol (ADVAIR) 250-50 MCG/ACT AEPB Inhale 1 puff into the lungs in the morning and at bedtime. (Patient not taking: Reported on 07/17/2021)   No facility-administered encounter medications on file as of 07/17/2021.     Review of Systems  Review of Systems  N/a Physical Exam  BP (!) 142/86 (BP Location: Left Arm, Cuff Size: Large)    Pulse (!) 117    Temp 98.7 F (37.1 C) (Oral)    Ht 6' 2.5" (1.892 m)    Wt (!) 352 lb 12.8 oz (160 kg)    SpO2 94%    BMI 44.69 kg/m   Wt Readings from Last 5 Encounters:  07/17/21 (!) 352 lb 12.8 oz (160 kg)  06/10/21 (!) 350 lb (158.8 kg)  06/03/21 (!) 353 lb (160.1 kg)  03/24/21 (!) 347 lb 12.8 oz (157.8 kg)  08/08/20 (!) 348 lb (157.9 kg)    BMI Readings from Last 5 Encounters:  07/17/21 44.69 kg/m  06/10/21 41.50 kg/m  06/03/21 41.86 kg/m  03/24/21 41.24 kg/m  08/08/20 41.27 kg/m      Physical Exam General: Sitting in chair, in no acute distress Eyes: EOMI, no icterus Neck: Supple, no JVP appreciated Cardiovascular: Regular rate and rhythm, no murmur Pulmonary: Clear to auscultation bilaterally, normal work of breathing, distant sounds Abdomen: Nondistended, bowel sounds present Skin: No synovitis, joint effusion Neuro: Normal gait, no weakness Psych: Normal mood, full affect   Assessment & Plan:   Dyspnea on exertion: Likely multifactorial.  Prior spirometry suggestive of moderate restriction versus gas trapping. PFT today suggestive of restriction from body wall/chest wall weight and interference.  Lastly,  worsening symptoms in the setting of worsening seasonal allergies and nasal polyps begs the question of possible underlying asthma.  Mild improvement with ICS/LABA. Marked improvement following ENT nasal surgery.  Tobacco abuse: Ongoing, encouraged to quit.   Return in about 6 months (around 01/14/2022).   Lanier Clam, MD 11/09/2021

## 2022-01-11 ENCOUNTER — Ambulatory Visit (INDEPENDENT_AMBULATORY_CARE_PROVIDER_SITE_OTHER): Payer: 59 | Admitting: Pulmonary Disease

## 2022-01-11 ENCOUNTER — Ambulatory Visit (INDEPENDENT_AMBULATORY_CARE_PROVIDER_SITE_OTHER): Payer: 59

## 2022-01-11 ENCOUNTER — Encounter: Payer: Self-pay | Admitting: Pulmonary Disease

## 2022-01-11 VITALS — BP 130/72 | HR 111 | Temp 98.2°F | Ht 76.0 in | Wt 361.4 lb

## 2022-01-11 DIAGNOSIS — R0782 Intercostal pain: Secondary | ICD-10-CM

## 2022-01-11 MED ORDER — TRELEGY ELLIPTA 200-62.5-25 MCG/ACT IN AEPB: 1.0000 | INHALATION_SPRAY | Freq: Two times a day (BID) | RESPIRATORY_TRACT | 11 refills | Status: DC

## 2022-01-11 NOTE — Patient Instructions (Signed)
Try Trelegy 1 puff once a day.  This is a strong inhaler.  Stronger on Advair.  Rinse your mouth out after every use.  If too expensive, please call the office and we will find an alternative. ? ?We will get a chest x-ray today, to see if there is any damage on the left after the fall. ? ?I will ask my colleagues about weight loss clinic. ? ?Return to clinic in 2 months or sooner as needed with Dr. Silas Flood ?

## 2022-01-12 ENCOUNTER — Encounter: Payer: Self-pay | Admitting: Pulmonary Disease

## 2022-01-12 NOTE — Progress Notes (Signed)
@Patient  ID: Jorge Cirri Sr., male    DOB: 02-06-1976, 46 y.o.   MRN: [id number]  Chief Complaint  Patient presents with   Follow-up    Pt states that the Advair is not helping him with this DOE. Pt does states that the Albuterol does help a little bit. HE states he fell last week and is now having more issues with his breathing noted.     Referring provider: Ailene Ravel, MD  HPI:   46 y.o. whom we are seeing in follow up for evaluation of dyspnea on exertion.    At last visit, he states dyspnea had completely resolved with nasal surgery.  Felt like his breathing was nose, improved symptoms.  Can stop Advair given lack of benefit.  He now returns to clinic with worsening dyspnea.  Seem to worsen over the last couple of months.  Difficult to do his job.  Hard to sleep.  Intake vitals reviewed with notable weight gain since last visit, 15+ pounds.  Gradual weight gain noted over time.  He states he is trying to lose weight but finds it difficult.  Unable to really exercise.  Trying to eat healthier but continues to gain weight.  HPI at initial visit: Patient is dyspnea on exertion over the last 2 or 3 years.  Worse with inclines or stairs.  Can occur on flat surfaces as well.  Relatively stable over the last 2 or 3 years.  He worse over the last year or so.  Associated worsening symptoms include worsening nasal congestion, seasonal allergies.  Diagnosed with nasal polyps by ENT March 2022.  No other environmental factors he can identify that make things better or worse.  No positional things better or worse.  No time of day when things are better or worse.  Has not tried any medication or anything to help with this.  He notes that about 3 weeks ago he was diagnosed with flu and told he had pneumonia based on chest x-ray.  He does not know the exact results of the chest x-ray.  He was given antibiotics.  Albuterol inhaler at that time.  It did help the cough associated with his acute  illness.  Otherwise never used inhalers.  Most recent chest imaging 04/2020 CT coronary reviewed with what I believed to be subtle mild emphysematous changes that can be seen in the middle portion of the lungs peripherally, otherwise clear lungs.  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          MMRC:     View : No data to display.          Epworth:      View : No data to display.          Tests:   FENO:  No results found for: NITRICOXIDE  PFT:    Latest Ref Rng & Units 07/17/2021    2:57 PM  PFT Results  FVC-Pre L 3.69    FVC-Predicted Pre % 61    FVC-Post L 3.79    FVC-Predicted Post % 63    Pre FEV1/FVC % % 74    Post FEV1/FCV % % 77    FEV1-Pre L 2.72  FEV1-Predicted Pre % 58    FEV1-Post L 2.94    DLCO uncorrected ml/min/mmHg 27.62    DLCO UNC% % 79    DLCO corrected ml/min/mmHg 27.62    DLCO COR %Predicted % 79    DLVA Predicted % 136    TLC L 5.27    TLC % Predicted % 67    RV % Predicted % 87    Reviewed and  interpreted as spirometry suggestive of moderate restriction vs gas trapping, mild restriction on lung volumes, DLCO WNL - suggestive of extra-parenchymal restriction, obesity  WALK:      View : No data to display.          Imaging: Personally reviewed as per EMR discussion this note  Lab Results: Personally reviewed CBC    Component Value Date/Time   WBC 10.2 06/03/2021 0900   RBC 4.93 06/03/2021 0900   HGB 14.9 06/03/2021 0900   HCT 45.9 06/03/2021 0900   PLT 286 06/03/2021 0900   MCV 93.1 06/03/2021 0900   MCH 30.2 06/03/2021 0900   MCHC 32.5 06/03/2021 0900   RDW 12.7 06/03/2021 0900    BMET No results found for: NA, K,  CL, CO2, GLUCOSE, BUN, CREATININE, CALCIUM, GFRNONAA, GFRAA  BNP No results found for: BNP  ProBNP No results found for: PROBNP  Specialty Problems       Pulmonary Problems   OSA (obstructive sleep apnea)    Uses CPAP as needed       Allergies  Allergen Reactions   Sulfa Antibiotics Shortness Of Breath and Swelling    Immunization History  Administered Date(s) Administered   Tdap 09/06/2009, 03/05/2021    Past Medical History:  Diagnosis Date   Abnormal EKG    prolonged/borderline prolonged QT   Arthritis    Bronchitis    Dyspnea    Edema    Fatigue    Headache    migraines   History of colon polyps    Lightheaded    Morbid obesity (HCC)    Pneumonia    Sleep apnea    uses cpap   Tobacco abuse    Weight gain     Tobacco History: Social History   Tobacco Use  Smoking Status Every Day   Packs/day: 1.00   Types: Cigarettes   Start date: 10/11/1991  Smokeless Tobacco Never  Tobacco Comments   Pt is smoking 1 ppd. 01/11/22 ALS    Ready to quit: Not Answered Counseling given: Not Answered Tobacco comments: Pt is smoking 1 ppd. 01/11/22 ALS    Continue to not smoke  Outpatient Encounter Medications as of 01/11/2022  Medication Sig   acetaminophen (TYLENOL) 500 MG tablet Take 1,000-2,000 mg by mouth daily as needed for moderate pain.   albuterol (VENTOLIN HFA) 108 (90 Base) MCG/ACT inhaler Inhale 2 puffs into the lungs every 6 (six) hours as needed for wheezing or shortness of breath.   Fluticasone-Umeclidin-Vilant (TRELEGY ELLIPTA) 200-62.5-25 MCG/ACT AEPB Inhale 1 puff into the lungs in the morning and at bedtime.   [DISCONTINUED] fluticasone-salmeterol (ADVAIR) 250-50 MCG/ACT AEPB Inhale 1 puff into the lungs in the morning and at bedtime.   No facility-administered encounter medications on file as of 01/11/2022.     Review of Systems  Review of Systems  N/a Physical Exam  BP 130/72 (BP Location: Left Arm, Patient Position: Sitting, Cuff Size:  Normal)   Pulse (!) 111   Temp 98.2 F (36.8 C) (Oral)   Ht 6\' 4"  (1.93 m)  Wt (!) 361 lb 6.4 oz (163.9 kg)   SpO2 95%   BMI 43.99 kg/m   Wt Readings from Last 5 Encounters:  01/11/22 (!) 361 lb 6.4 oz (163.9 kg)  07/17/21 (!) 352 lb 12.8 oz (160 kg)  06/10/21 (!) 350 lb (158.8 kg)  06/03/21 (!) 353 lb (160.1 kg)  03/24/21 (!) 347 lb 12.8 oz (157.8 kg)    BMI Readings from Last 5 Encounters:  01/11/22 43.99 kg/m  07/17/21 44.69 kg/m  06/10/21 41.50 kg/m  06/03/21 41.86 kg/m  03/24/21 41.24 kg/m     Physical Exam General: Sitting in chair, in no acute distress Eyes: EOMI, no icterus Neck: Supple, no JVP appreciated Cardiovascular: Regular rate and rhythm, no murmur Pulmonary: Clear to auscultation bilaterally, normal work of breathing, distant sounds Abdomen: Nondistended, bowel sounds present Skin: No synovitis, joint effusion Neuro: Normal gait, no weakness Psych: Normal mood, full affect   Assessment & Plan:   Dyspnea on exertion: Initially improved with ENT surgery clearing nasal passages, now worsened again.  Likely multifactorial.  Prior spirometry suggestive of moderate restriction versus gas trapping. PFT follow-up 2022 suggestive of restriction from body wall/chest wall weight and interference given normal DLCO.  Weight is up from last visit.  Deconditioning at play.  Likely contribution from ongoing cigarette smoking.  Concern for asthma given description in the past of worsening breathing around seasonal changes etc.  Unfortunately Advair has not been beneficial in the past.  Asthma: Presumed diagnosis given atopic symptoms and worsening breathing at times during seasonal changes.  Worsening dyspnea suspected to be related to other factors.  However, escalate to triple inhaled therapy via high-dose Trelegy.  Advair was not beneficial in the past.  OSA: Reports good adherence to CPAP.  We will attempt to obtain report from machine to evaluate for how well  controlled apnea is on current settings.  Possible poorly controlled OSA is contributing to worsening dyspnea.  Tobacco abuse: Ongoing, encouraged to quit.   Return in about 2 months (around 03/13/2022).   Karren Burly, MD 01/12/2022

## 2022-01-12 NOTE — Progress Notes (Signed)
Chest x-ray demonstrates changes most likely related to chronic inflammation and air tubes.  Could represent mild fluid buildup.  If symptoms are not improving with new inhaler, recommend contacting cardiologist for further evaluation.  We briefly discussed this at time of visit.

## 2022-01-14 ENCOUNTER — Telehealth: Payer: Self-pay | Admitting: Cardiology

## 2022-01-14 NOTE — Telephone Encounter (Signed)
Pt c/o Shortness Of Breath: STAT if SOB developed within the last 24 hours or pt is noticeably SOB on the phone ? ?1. Are you currently SOB (can you hear that pt is SOB on the phone)?  ?Patient's wife states she is not currently with the patient. ? ?2. How long have you been experiencing SOB?  ?Patient's wife states SOB has been going on for a while, but unable to gauge exactly how long. She states the patient assumes it is not cardiac related, but pulmonology advised to follow up with cardiology. ? ?3. Are you SOB when sitting or when up moving around?  ?When up and moving around ? ?4. Are you currently experiencing any other symptoms?  ?No  ? ?

## 2022-01-14 NOTE — Telephone Encounter (Signed)
Spoke with wife. ?Reports pt has been experiencing SOB for "awhile" now, but worsening. ?SOB is worse with activity. ?Saw pulmonology on 5/9, new inhaler prescribed and advised if doesn't improve in several weeks to call them. ?However they advised pt to follow up w/ cardiology, pt is scheduled to see PA on 6/16. ?They will call pulmonology if no improvement/worsens as instructed. ? ? ?

## 2022-02-18 DIAGNOSIS — R0602 Shortness of breath: Secondary | ICD-10-CM

## 2022-02-18 HISTORY — DX: Shortness of breath: R06.02

## 2022-02-18 NOTE — Progress Notes (Signed)
Cardiology Office Note:    Date:  02/19/2022   ID:  Jorge Meeker Sr., DOB 10-01-1975, MRN [id number]  PCP:  Leonides Sake, MD  [facility] Providers Cardiologist:  Candee Furbish, MD    Referring MD: Leonides Sake, MD   Chief Complaint:  Shortness of Breath    Patient Profile: Family history of premature CAD +Cigs Obesity  Prior CV Studies: CT CARDIAC SCORING 04/18/2020 IMPRESSION: Coronary calcium score of 0 Agatston units. This suggests low risk for future cardiac events.  IMPRESSION: 1.  No acute findings in the imaged extracardiac chest. 2. Left upper lobe 3 mm pulmonary nodule. No follow-up needed if patient is low-risk. Non-contrast chest CT can be considered in 12 months if patient is high-risk. This recommendation follows the consensus statement: Guidelines for Management of Incidental Pulmonary Nodules Detected on CT Images: From the Fleischner Society 2017; Radiology 2017; 284:228-243.     Echocardiogram 04/18/2020 EF 55-60, no RWMA, GR 1 DD, normal RVSF  History of Present Illness:   Jorge OLUND Sr. is a 46 y.o. male with the above problem list.  He was evaluated by Dr. Marlou Porch in 2021 for an abnormal EKG and family history of CAD.  Echocardiogram did demonstrate normal LV function and mild diastolic dysfunction.  CAC score was 0.  He has recently been seen by pulmonology for shortness of breath.  He has had some restriction noted on PFTs and has been prescribed inhalers for possible asthma.  Recent chest x-ray did demonstrate possible mild interstitial edema.  He has been asked to follow-up with cardiology for his shortness of breath.  He is here today with his wife.  He notes he has been short of breath for 3 years.  This started after contracting COVID-19.  He notes chronic chest discomfort.  He he had fractured ribs many years ago and has been worked up for chest pain in the past.  Over the past 3 years, his shortness of breath has been fairly stable.   He has not had syncope.  He sleeps on an incline chronically.  He uses CPAP at night.  He does have lower extremity edema that resolves with elevation.        Past Medical History:  Diagnosis Date   Abnormal EKG    prolonged/borderline prolonged QT   Arthritis    Bronchitis    Dyspnea    Edema    Fatigue    Headache    migraines   History of colon polyps    Lightheaded    Morbid obesity (HCC)    Pneumonia    Sleep apnea    uses cpap   Tobacco abuse    Weight gain    Current Medications: Current Meds  Medication Sig   acetaminophen (TYLENOL) 500 MG tablet Take 1,000-2,000 mg by mouth daily as needed for moderate pain.   albuterol (VENTOLIN HFA) 108 (90 Base) MCG/ACT inhaler Inhale 2 puffs into the lungs every 6 (six) hours as needed for wheezing or shortness of breath.   Fluticasone-Umeclidin-Vilant (TRELEGY ELLIPTA) 200-62.5-25 MCG/ACT AEPB Inhale 1 puff into the lungs in the morning and at bedtime.    Allergies:   Sulfa antibiotics   Social History   Tobacco Use   Smoking status: Every Day    Packs/day: 1.00    Types: Cigarettes    Start date: 10/11/1991   Smokeless tobacco: Never   Tobacco comments:    Pt is smoking 1 ppd. 01/11/22 ALS   Vaping  Use   Vaping Use: Never used  Substance Use Topics   Alcohol use: Never   Drug use: Never    Family Hx: The patient's family history includes Asthma in his maternal grandmother; Heart Problems in his maternal grandfather and paternal uncle; Heart disease in his father; Hyperlipidemia in his mother; Hypertension in his maternal grandfather and maternal grandmother; Stomach cancer in his paternal grandmother. There is no history of Colon polyps, Colon cancer, Pancreatic cancer, Esophageal cancer, or Liver disease.  Review of Systems  Respiratory:  Positive for cough. Negative for wheezing.   Gastrointestinal:  Negative for hematochezia.  Genitourinary:  Negative for hematuria.     EKGs/Labs/Other Test Reviewed:    EKG:   EKG is   ordered today.  The ekg ordered today demonstrates NSR, HR 89, normal axis, no ST-T wave changes, QTc 476  Recent Labs: 06/03/2021: Hemoglobin 14.9; Platelets 286   Recent Lipid Panel No results for input(s): "CHOL", "TRIG", "HDL", "VLDL", "LDLCALC", "LDLDIRECT" in the last 8760 hours.   PFTs 07/17/2021 FEV1 58% FEV1/FVC 93% DLCO 79% Mixed restriction and gas trapping-suggestive of extrathoracic restriction and small airways disease  Risk Assessment/Calculations/Metrics:              Physical Exam:    VS:  BP 122/90   Pulse 94   Ht '6\' 4"'$  (1.93 m)   Wt (!) 361 lb (163.7 kg)   SpO2 96%   BMI 43.94 kg/m     Wt Readings from Last 3 Encounters:  02/19/22 (!) 361 lb (163.7 kg)  01/11/22 (!) 361 lb 6.4 oz (163.9 kg)  07/17/21 (!) 352 lb 12.8 oz (160 kg)    Constitutional:      Appearance: Healthy appearance. Not in distress.  Pulmonary:     Effort: Pulmonary effort is normal.     Breath sounds: No wheezing. No rales.  Cardiovascular:     Normal rate. Regular rhythm. Normal S1. Normal S2.      Murmurs: There is no murmur.  Edema:    Peripheral edema absent.  Abdominal:     Palpations: Abdomen is soft.  Musculoskeletal:     Cervical back: Neck supple. Skin:    General: Skin is warm and dry.  Neurological:     General: No focal deficit present.     Mental Status: Alert and oriented to person, place and time.         ASSESSMENT & PLAN:   Shortness of breath Etiology of his shortness of breath is likely multifactorial and related to ongoing smoking, obesity, sleep apnea, restrictive lung disease.  He does note his symptoms started after developing COVID-19 3 years ago.  He does have a strong family history of coronary artery disease.  He continues to smoke.  His electrocardiogram does not demonstrate any significant changes.  He had a coronary artery calcium score of 0 in 2021.  Therefore, the likelihood he has developed significant obstructive coronary disease is  fairly low.  He has fairly chronic chest pain.  I have recommended proceeding with stress testing to rule out the possibility of ischemia.  I have also recommended following up with a repeat echocardiogram.  His blood pressure has been running somewhat high and he does note lower extremity edema.  I think he would benefit from a low-dose thiazide diuretic. Cardiac PET 2D echocardiogram CBC, BMET, BNP HCTZ 12.5 mg daily BMET 1 week Follow-up 3-4 months  Elevated blood pressure reading He has had several blood pressures above target.  Start HCTZ 12.5 mg daily.  BMET today and repeat in 1 week.  OSA (obstructive sleep apnea) He uses CPAP nightly.  Tobacco abuse He has reduced his smoking significantly.  Cessation is recommended.        Shared Decision Making/Informed Consent The risks [chest pain, shortness of breath, cardiac arrhythmias, dizziness, blood pressure fluctuations, myocardial infarction, stroke/transient ischemic attack, nausea, vomiting, allergic reaction, radiation exposure, metallic taste sensation and life-threatening complications (estimated to be 1 in 10,000)], benefits (risk stratification, diagnosing coronary artery disease, treatment guidance) and alternatives of a cardiac PET stress test were discussed in detail with Mr. Davidian and he agrees to proceed.   Dispo:  Return in about 4 months (around 06/21/2022) for Follow up after testing with Dr. Marlou Porch.   Medication Adjustments/Labs and Tests Ordered: Current medicines are reviewed at length with the patient today.  Concerns regarding medicines are outlined above.  Tests Ordered: Orders Placed This Encounter  Procedures   NM PET CT CARDIAC PERFUSION MULTI W/ABSOLUTE BLOODFLOW   Basic Metabolic Panel (BMET)   Pro b natriuretic peptide   CBC   Basic Metabolic Panel (BMET)   Cardiac Stress Test: Informed Consent Details: Physician/Practitioner Attestation; Transcribe to consent form and obtain patient signature    EKG 12-Lead   ECHOCARDIOGRAM COMPLETE   Medication Changes: Meds ordered this encounter  Medications   hydrochlorothiazide (MICROZIDE) 12.5 MG capsule    Sig: Take 1 capsule (12.5 mg total) by mouth daily.    Dispense:  90 capsule    Refill:  3   Signed, Richardson Dopp, PA-C  02/19/2022 12:52 PM    [facility] [place], [place], [place]  [address] Phone: [phone]; Fax: [phone]

## 2022-02-19 ENCOUNTER — Ambulatory Visit (INDEPENDENT_AMBULATORY_CARE_PROVIDER_SITE_OTHER): Payer: 59 | Admitting: Physician Assistant

## 2022-02-19 ENCOUNTER — Encounter: Payer: Self-pay | Admitting: Physician Assistant

## 2022-02-19 VITALS — BP 122/90 | HR 94 | Ht 76.0 in | Wt 361.0 lb

## 2022-02-19 DIAGNOSIS — R0602 Shortness of breath: Secondary | ICD-10-CM

## 2022-02-19 DIAGNOSIS — G4733 Obstructive sleep apnea (adult) (pediatric): Secondary | ICD-10-CM

## 2022-02-19 DIAGNOSIS — R030 Elevated blood-pressure reading, without diagnosis of hypertension: Secondary | ICD-10-CM | POA: Insufficient documentation

## 2022-02-19 DIAGNOSIS — R072 Precordial pain: Secondary | ICD-10-CM

## 2022-02-19 DIAGNOSIS — Z720 Tobacco use: Secondary | ICD-10-CM

## 2022-02-19 MED ORDER — HYDROCHLOROTHIAZIDE 12.5 MG PO CAPS: 12.5000 mg | ORAL_CAPSULE | Freq: Every day | ORAL | 3 refills | Status: DC

## 2022-02-19 NOTE — Assessment & Plan Note (Signed)
He uses CPAP nightly. 

## 2022-02-19 NOTE — Assessment & Plan Note (Signed)
Etiology of his shortness of breath is likely multifactorial and related to ongoing smoking, obesity, sleep apnea, restrictive lung disease.  He does note his symptoms started after developing COVID-19 3 years ago.  He does have a strong family history of coronary artery disease.  He continues to smoke.  His electrocardiogram does not demonstrate any significant changes.  He had a coronary artery calcium score of 0 in 2021.  Therefore, the likelihood he has developed significant obstructive coronary disease is fairly low.  He has fairly chronic chest pain.  I have recommended proceeding with stress testing to rule out the possibility of ischemia.  I have also recommended following up with a repeat echocardiogram.  His blood pressure has been running somewhat high and he does note lower extremity edema.  I think he would benefit from a low-dose thiazide diuretic.  Cardiac PET  2D echocardiogram  CBC, BMET, BNP  HCTZ 12.5 mg daily  BMET 1 week  Follow-up 3-4 months

## 2022-02-19 NOTE — Assessment & Plan Note (Signed)
He has reduced his smoking significantly.  Cessation is recommended.

## 2022-02-19 NOTE — Assessment & Plan Note (Signed)
He has had several blood pressures above target.  Start HCTZ 12.5 mg daily.  BMET today and repeat in 1 week.

## 2022-02-19 NOTE — Patient Instructions (Signed)
Medication Instructions:   START HCTZ one (1) tablet by mouth ( 12.5 mg) daily.   *If you need a refill on your cardiac medications before your next appointment, please call your pharmacy*   Lab Work:  TODAY!!!! BMET/PRO BNP/CBC  Your physician recommends that you return for lab work on Friday, June 23. You can come in on the day of your appointment anytime between 7:30-4:30.   If you have labs (blood work) drawn today and your tests are completely normal, you will receive your results only by: [place] (if you have MyChart) OR A paper copy in the mail If you have any lab test that is abnormal or we need to change your treatment, we will call you to review the results.   Testing/Procedures: Your physician has requested that you have an echocardiogram. Echocardiography is a painless test that uses sound waves to create images of your heart. It provides your doctor with information about the size and shape of your heart and how well your heart's chambers and valves are working. This procedure takes approximately one hour. There are no restrictions for this procedure.  How to Prepare for Your Cardiac PET/CT Stress Test:  1. Please do not take these medications before your test:   Medications that may interfere with the cardiac pharmacological stress agent (ex. nitrates - including erectile dysfunction medications or beta-blockers) the day of the exam. (Erectile dysfunction medication should be held for at least 72 hrs prior to test) Theophylline containing medications for 12 hours. Dipyridamole 48 hours prior to the test. Your remaining medications may be taken with water.  2. Nothing to eat or drink, except water, 3 hours prior to arrival time.   NO caffeine/decaffeinated products, or chocolate 12 hours prior to arrival.  3. NO cologne or lotion  4. Total time is 1 to 2 hours; you may want to bring reading material for the waiting time.  5. Please report to Admitting at  the [place] Entrance 60 minutes early for your test.  [place], [place] [address]   IF YOU THINK YOU MAY BE PREGNANT, OR ARE NURSING PLEASE INFORM THE TECHNOLOGIST.  In preparation for your appointment, medication and supplies will be purchased.  Appointment availability is limited, so if you need to cancel or reschedule, please call the Radiology Department at [phone]  24 hours in advance to avoid a cancellation fee of $100.00  What to Expect After you Arrive:  Once you arrive and check in for your appointment, you will be taken to a preparation room within the Radiology Department.  A technologist or Nurse will obtain your medical history, verify that you are correctly prepped for the exam, and explain the procedure.  Afterwards,  an IV will be started in your arm and electrodes will be placed on your skin for EKG monitoring during the stress portion of the exam. Then you will be escorted to the PET/CT scanner.  There, staff will get you positioned on the scanner and obtain a blood pressure and EKG.  During the exam, you will continue to be connected to the EKG and blood pressure machines.  A small, safe amount of a radioactive tracer will be injected in your IV to obtain a series of pictures of your heart along with an injection of a stress agent.    After your Exam:  It is recommended that you eat a meal and drink a caffeinated beverage to counter act any effects of the  stress agent.  Drink plenty of fluids for the remainder of the day and urinate frequently for the first couple of hours after the exam.  Your doctor will inform you of your test results within 7-10 business days.  For questions about your test or how to prepare for your test, please call: Marchia Bond, Cardiac Imaging Nurse Navigator  Gordy Clement, Cardiac Imaging Nurse Navigator Office: [phone]    Follow-Up: At [facility], you and your health needs are our priority.  As  part of our continuing mission to provide you with exceptional heart care, we have created designated Provider Care Teams.  These Care Teams include your primary Cardiologist (physician) and Advanced Practice Providers (APPs -  Physician Assistants and Nurse Practitioners) who all work together to provide you with the care you need, when you need it.  We recommend signing up for the patient portal called "MyChart".  Sign up information is provided on this After Visit Summary.  MyChart is used to connect with patients for Virtual Visits (Telemedicine).  Patients are able to view lab/test results, encounter notes, upcoming appointments, etc.  Non-urgent messages can be sent to your provider as well.   To learn more about what you can do with MyChart, go to [email or web address].    Your next appointment:   3 month(s)  The format for your next appointment:   In Person  Provider:   Candee Furbish, MD      Important Information About Sugar

## 2022-02-20 LAB — BASIC METABOLIC PANEL
BUN/Creatinine Ratio: 18 (ref 9–20)
BUN: 14 mg/dL (ref 6–24)
CO2: 23 mmol/L (ref 20–29)
Calcium: 9.5 mg/dL (ref 8.7–10.2)
Chloride: 104 mmol/L (ref 96–106)
Creatinine, Ser: 0.76 mg/dL (ref 0.76–1.27)
Glucose: 83 mg/dL (ref 70–99)
Potassium: 4.3 mmol/L (ref 3.5–5.2)
Sodium: 140 mmol/L (ref 134–144)
eGFR: 113 mL/min/{1.73_m2} (ref >59)

## 2022-02-20 LAB — CBC
Hematocrit: 43.5 % (ref 37.5–51.0)
Hemoglobin: 14.7 g/dL (ref 13.0–17.7)
MCH: 29.8 pg (ref 26.6–33.0)
MCHC: 33.8 g/dL (ref 31.5–35.7)
MCV: 88 fL (ref 79–97)
Platelets: 319 10*3/uL (ref 150–450)
RBC: 4.93 x10E6/uL (ref 4.14–5.80)
RDW: 12.3 % (ref 11.6–15.4)
WBC: 12.5 10*3/uL — ABNORMAL HIGH (ref 3.4–10.8)

## 2022-02-20 LAB — PRO B NATRIURETIC PEPTIDE: NT-Pro BNP: <36 pg/mL (ref 0–121)

## 2022-02-22 ENCOUNTER — Telehealth: Payer: Self-pay

## 2022-02-22 DIAGNOSIS — R072 Precordial pain: Secondary | ICD-10-CM

## 2022-02-22 DIAGNOSIS — R0602 Shortness of breath: Secondary | ICD-10-CM

## 2022-02-22 NOTE — Telephone Encounter (Signed)
Spoke with patient to discuss lab results.  Per Richardson Dopp, PA-C: Hemoglobin, creatinine, K+, BNP normal.  WBC elevated PLAN:  -Continue current medications/treatment plan and follow up as scheduled.  -Ask patient if he has had any fever, worsening cough, dysuria, rash that could signify infection >> if so, see PCP this week.  -Add CBC with diff to labs on 6/23  Patient denies any s/sx of infection. He does report he has a low-grade fever last week (which he states he mentioned during appt with Nicki Reaper), but does not believe he has had any fever since that time.  Patient verbalized understanding of the above and agrees with plan. CBC w/diff added to labs scheduled for 02/26/22.

## 2022-02-22 NOTE — Telephone Encounter (Signed)
-----   Message from Liliane Shi, [place] sent at 02/22/2022  4:35 PM EDT ----- Hemoglobin, creatinine, K+, BNP normal.  WBC elevated PLAN:  -Continue current medications/treatment plan and follow up as scheduled.  -Ask patient if he has had any fever, worsening cough, dysuria, rash that could signify infection >> if so, see PCP this week.  -Add CBC with diff to labs on 6/23 Richardson Dopp, PA-C    02/22/2022 4:29 PM

## 2022-02-26 ENCOUNTER — Other Ambulatory Visit: Payer: 59

## 2022-02-26 DIAGNOSIS — R0602 Shortness of breath: Secondary | ICD-10-CM

## 2022-02-26 DIAGNOSIS — R072 Precordial pain: Secondary | ICD-10-CM

## 2022-02-26 DIAGNOSIS — G4733 Obstructive sleep apnea (adult) (pediatric): Secondary | ICD-10-CM

## 2022-02-26 DIAGNOSIS — R030 Elevated blood-pressure reading, without diagnosis of hypertension: Secondary | ICD-10-CM

## 2022-02-27 LAB — BASIC METABOLIC PANEL
BUN/Creatinine Ratio: 24 — ABNORMAL HIGH (ref 9–20)
BUN: 17 mg/dL (ref 6–24)
CO2: 23 mmol/L (ref 20–29)
Calcium: 9.1 mg/dL (ref 8.7–10.2)
Chloride: 106 mmol/L (ref 96–106)
Creatinine, Ser: 0.72 mg/dL — ABNORMAL LOW (ref 0.76–1.27)
Glucose: 89 mg/dL (ref 70–99)
Potassium: 4.1 mmol/L (ref 3.5–5.2)
Sodium: 144 mmol/L (ref 134–144)
eGFR: 115 mL/min/{1.73_m2} (ref >59)

## 2022-02-27 LAB — CBC WITH DIFFERENTIAL/PLATELET
Basophils Absolute: 0.1 10*3/uL (ref 0.0–0.2)
Basos: 1 %
EOS (ABSOLUTE): 0.2 10*3/uL (ref 0.0–0.4)
Eos: 2 %
Hematocrit: 43.4 % (ref 37.5–51.0)
Hemoglobin: 14.6 g/dL (ref 13.0–17.7)
Immature Grans (Abs): 0 10*3/uL (ref 0.0–0.1)
Immature Granulocytes: 0 %
Lymphocytes Absolute: 4 10*3/uL — ABNORMAL HIGH (ref 0.7–3.1)
Lymphs: 34 %
MCH: 30 pg (ref 26.6–33.0)
MCHC: 33.6 g/dL (ref 31.5–35.7)
MCV: 89 fL (ref 79–97)
Monocytes Absolute: 1.1 10*3/uL — ABNORMAL HIGH (ref 0.1–0.9)
Monocytes: 9 %
Neutrophils Absolute: 6.4 10*3/uL (ref 1.4–7.0)
Neutrophils: 54 %
Platelets: 336 10*3/uL (ref 150–450)
RBC: 4.86 x10E6/uL (ref 4.14–5.80)
RDW: 12.5 % (ref 11.6–15.4)
WBC: 11.8 10*3/uL — ABNORMAL HIGH (ref 3.4–10.8)

## 2022-03-05 ENCOUNTER — Ambulatory Visit (HOSPITAL_COMMUNITY): Payer: 59 | Attending: Cardiology

## 2022-03-05 ENCOUNTER — Encounter: Payer: Self-pay | Admitting: Physician Assistant

## 2022-03-05 DIAGNOSIS — R072 Precordial pain: Secondary | ICD-10-CM | POA: Diagnosis present

## 2022-03-05 DIAGNOSIS — R0602 Shortness of breath: Secondary | ICD-10-CM | POA: Diagnosis present

## 2022-03-05 DIAGNOSIS — R030 Elevated blood-pressure reading, without diagnosis of hypertension: Secondary | ICD-10-CM

## 2022-03-05 DIAGNOSIS — G4733 Obstructive sleep apnea (adult) (pediatric): Secondary | ICD-10-CM | POA: Diagnosis present

## 2022-03-05 LAB — ECHOCARDIOGRAM COMPLETE
Area-P 1/2: 4.74 cm2
S' Lateral: 3.3 cm

## 2022-03-05 MED ORDER — PERFLUTREN LIPID MICROSPHERE
1.0000 mL | INTRAVENOUS | Status: AC | PRN
  Administered 2022-03-05: 3 mL via INTRAVENOUS

## 2022-03-05 NOTE — Progress Notes (Signed)
Pt has been made aware of normal result and verbalized understanding.  jw

## 2022-03-31 ENCOUNTER — Encounter: Payer: Self-pay | Admitting: Pulmonary Disease

## 2022-03-31 ENCOUNTER — Ambulatory Visit (INDEPENDENT_AMBULATORY_CARE_PROVIDER_SITE_OTHER): Payer: 59

## 2022-03-31 ENCOUNTER — Ambulatory Visit (INDEPENDENT_AMBULATORY_CARE_PROVIDER_SITE_OTHER): Payer: 59 | Admitting: Pulmonary Disease

## 2022-03-31 DIAGNOSIS — R0782 Intercostal pain: Secondary | ICD-10-CM | POA: Diagnosis not present

## 2022-03-31 NOTE — Patient Instructions (Signed)
Nice to see you again  Okay to stop Trelegy for now.  If you notice your work of breathing worsens in the interim before next visit please let me know via [place].  I will plan to start the lower dose of Trelegy since the higher dose seem to be a little too much.  We will get a chest x-ray today and we will be in touch about the results  Return to clinic in 3 months or sooner if needed with Dr. Silas Flood

## 2022-04-02 ENCOUNTER — Other Ambulatory Visit: Payer: Self-pay | Admitting: Pulmonary Disease

## 2022-04-02 DIAGNOSIS — J339 Nasal polyp, unspecified: Secondary | ICD-10-CM

## 2022-04-02 DIAGNOSIS — R0609 Other forms of dyspnea: Secondary | ICD-10-CM

## 2022-04-02 MED ORDER — TRELEGY ELLIPTA 100-62.5-25 MCG/ACT IN AEPB: 1.0000 | INHALATION_SPRAY | Freq: Every day | RESPIRATORY_TRACT | 2 refills | Status: DC

## 2022-04-02 NOTE — Progress Notes (Signed)
CXR without any cause for the pain - showed chronic signs of bronchitis but no changes since last CXR.

## 2022-04-05 NOTE — Progress Notes (Signed)
$'@Patient'Q$  ID: Jorge Meeker Sr., male    DOB: [birth year], 46 y.o.   MRN: [id number]  Chief Complaint  Patient presents with   Follow-up    Pt is here for follow up for DOE. Pt believes that the trelegy is too strong. Pt states since he got a tooth removed that was infected he can breath better and has backed off the trelegy some.     Referring provider: Leonides Sake, MD  HPI:   46 y.o. whom we are seeing in follow up for evaluation of dyspnea on exertion.    At last visit, breathing was worsened.  Previously improved with sinus surgery.  Was placed on Trelegy for presumed severe chronic bronchitis versus asthma.  He states this was "too strong for him."  I tried to ask clarifying questions with difficult to articulate what this means.  He states his breathing has been fine.  He is interested in stopping the Trelegy or inhaler once again.  We discussed this would be okay with need to resume if gets worse.  Could always try low-dose or 100 mcg Trelegy given his concern for it being "too strong."  HPI at initial visit: Patient is dyspnea on exertion over the last 2 or 3 years.  Worse with inclines or stairs.  Can occur on flat surfaces as well.  Relatively stable over the last 2 or 3 years.  He worse over the last year or so.  Associated worsening symptoms include worsening nasal congestion, seasonal allergies.  Diagnosed with nasal polyps by ENT March 2022.  No other environmental factors he can identify that make things better or worse.  No positional things better or worse.  No time of day when things are better or worse.  Has not tried any medication or anything to help with this.  He notes that about 3 weeks ago he was diagnosed with flu and told he had pneumonia based on chest x-ray.  He does not know the exact results of the chest x-ray.  He was given antibiotics.  Albuterol inhaler at that time.  It did help the cough associated with his acute illness.  Otherwise never used  inhalers.  Most recent chest imaging 04/2020 CT coronary reviewed with what I believed to be subtle mild emphysematous changes that can be seen in the middle portion of the lungs peripherally, otherwise clear lungs.  PMH: Seasonal allergies, recurrent pneumonias, nasal polyps Surgical history: Reviewed with patient, denies any Family history: CAD in father Social history: Current smoker, 1 pack/day for many years, lives in [place] city-grew up there, works as Higher education careers adviser / Pulmonary Flowsheets:   ACT:      No data to display           MMRC:     No data to display           Epworth:      No data to display           Tests:   FENO:  No results found for: "NITRICOXIDE"  PFT:    Latest Ref Rng & Units 07/17/2021    2:57 PM  PFT Results  FVC-Pre L 3.69   FVC-Predicted Pre % 61   FVC-Post L 3.79   FVC-Predicted Post % 63   Pre FEV1/FVC % % 74   Post FEV1/FCV % % 77   FEV1-Pre L 2.72   FEV1-Predicted Pre % 58   FEV1-Post L 2.94   DLCO uncorrected ml/min/mmHg 27.62  DLCO UNC% % 79   DLCO corrected ml/min/mmHg 27.62   DLCO COR %Predicted % 79   DLVA Predicted % 136   TLC L 5.27   TLC % Predicted % 67   RV % Predicted % 87   Reviewed and  interpreted as spirometry suggestive of moderate restriction vs gas trapping, mild restriction on lung volumes, DLCO WNL - suggestive of extra-parenchymal restriction, obesity  WALK:      No data to display           Imaging: Personally reviewed as per EMR discussion this note  Lab Results: Personally reviewed CBC    Component Value Date/Time   WBC 11.8 (H) 02/26/2022 0742   WBC 10.2 06/03/2021 0900   RBC 4.86 02/26/2022 0742   RBC 4.93 06/03/2021 0900   HGB 14.6 02/26/2022 0742   HCT 43.4 02/26/2022 0742   PLT 336 02/26/2022 0742   MCV 89 02/26/2022 0742   MCH 30.0 02/26/2022 0742   MCH 30.2 06/03/2021 0900   MCHC 33.6 02/26/2022 0742   MCHC 32.5 06/03/2021 0900   RDW 12.5 02/26/2022  0742   LYMPHSABS 4.0 (H) 02/26/2022 0742   EOSABS 0.2 02/26/2022 0742   BASOSABS 0.1 02/26/2022 0742    BMET    Component Value Date/Time   NA 144 02/26/2022 0742   K 4.1 02/26/2022 0742   CL 106 02/26/2022 0742   CO2 23 02/26/2022 0742   GLUCOSE 89 02/26/2022 0742   BUN 17 02/26/2022 0742   CREATININE 0.72 (L) 02/26/2022 0742   CALCIUM 9.1 02/26/2022 0742    BNP No results found for: "BNP"  ProBNP    Component Value Date/Time   PROBNP <36 02/19/2022 0906    Specialty Problems       Pulmonary Problems   OSA (obstructive sleep apnea)    Uses CPAP as needed      Shortness of breath    Echocardiogram 02/2022: EF 55-60, no RWMA, normal RVSF, trivial MR       Allergies  Allergen Reactions   Sulfa Antibiotics Shortness Of Breath and Swelling    Immunization History  Administered Date(s) Administered   Tdap 09/06/2009, 03/05/2021    Past Medical History:  Diagnosis Date   Abnormal EKG    prolonged/borderline prolonged QT   Arthritis    Bronchitis    Dyspnea    Edema    Fatigue    Headache    migraines   History of colon polyps    Lightheaded    Morbid obesity ([facility])    Pneumonia    Shortness of breath 02/18/2022   Echocardiogram 02/2022: EF 55-60, no RWMA, normal RVSF, trivial MR   Sleep apnea    uses cpap   Tobacco abuse    Weight gain     Tobacco History: Social History   Tobacco Use  Smoking Status Every Day   Packs/day: 1.00   Types: Cigarettes   Start date: 10/11/1991  Smokeless Tobacco Never  Tobacco Comments   Pt is smoking 1 ppd. 01/11/22 ALS    Ready to quit: Not Answered Counseling given: Not Answered Tobacco comments: Pt is smoking 1 ppd. 01/11/22 ALS    Continue to not smoke  Outpatient Encounter Medications as of 03/31/2022  Medication Sig   acetaminophen (TYLENOL) 500 MG tablet Take 1,000-2,000 mg by mouth daily as needed for moderate pain.   Fluticasone-Umeclidin-Vilant (TRELEGY ELLIPTA) 200-62.5-25 MCG/ACT AEPB Inhale 1  puff into the lungs in the morning and at bedtime.  hydrochlorothiazide (MICROZIDE) 12.5 MG capsule Take 1 capsule (12.5 mg total) by mouth daily.   [DISCONTINUED] albuterol (VENTOLIN HFA) 108 (90 Base) MCG/ACT inhaler Inhale 2 puffs into the lungs every 6 (six) hours as needed for wheezing or shortness of breath.   No facility-administered encounter medications on file as of 03/31/2022.     Review of Systems  Review of Systems  N/a Physical Exam  BP 136/70 (BP Location: Left Arm, Patient Position: Sitting, Cuff Size: Normal)   Pulse 99   Ht '6\' 5"'$  (1.956 m)   Wt (!) 369 lb 3.2 oz (167.5 kg)   SpO2 98%   BMI 43.78 kg/m   Wt Readings from Last 5 Encounters:  03/31/22 (!) 369 lb 3.2 oz (167.5 kg)  02/19/22 (!) 361 lb (163.7 kg)  01/11/22 (!) 361 lb 6.4 oz (163.9 kg)  07/17/21 (!) 352 lb 12.8 oz (160 kg)  06/10/21 (!) 350 lb (158.8 kg)    BMI Readings from Last 5 Encounters:  03/31/22 43.78 kg/m  02/19/22 43.94 kg/m  01/11/22 43.99 kg/m  07/17/21 44.69 kg/m  06/10/21 41.50 kg/m     Physical Exam General: Sitting in chair, in no acute distress Eyes: EOMI, no icterus Neck: Supple, no JVP appreciated Cardiovascular: Regular rate and rhythm, no murmur Pulmonary: Clear to auscultation bilaterally, normal work of breathing, distant sounds Abdomen: Nondistended, bowel sounds present Skin: No synovitis, joint effusion Neuro: Normal gait, no weakness Psych: Normal mood, full affect   Assessment & Plan:   Dyspnea on exertion: Initially improved with ENT surgery clearing nasal passages, now worsened again.  Likely multifactorial.  Prior spirometry suggestive of moderate restriction versus gas trapping. PFT follow-up 2022 suggestive of restriction from chest wall/weight and interference given normal DLCO. Deconditioning at play.  Likely contribution from ongoing cigarette smoking.  Concern for asthma given description in the past of worsening breathing around seasonal  changes etc. Now seems improved on Trelegy, but he is unsure. See below  Asthma: Presumed diagnosis given atopic symptoms and worsening breathing at times during seasonal changes.  Worsening dyspnea suspected to be related to other factors.  However, escalate to triple inhaled therapy via high-dose Trelegy spring 2023.  Advair was not beneficial in the past.  Feels like it is too strong medicine.  Okay to stop for now given improvement in dyspnea.  If need to resume, consider starting 100 mcg dose Trelegy as opposed to the 200 mcg dose previously prescribed.  OSA: Reports good adherence to CPAP historically.   Return in about 3 months (around 07/01/2022).   Lanier Clam, MD 04/05/2022

## 2022-05-17 ENCOUNTER — Other Ambulatory Visit: Payer: Self-pay

## 2022-05-24 ENCOUNTER — Ambulatory Visit: Payer: 59 | Admitting: Cardiology

## 2022-05-27 ENCOUNTER — Other Ambulatory Visit: Payer: Self-pay | Admitting: *Deleted

## 2022-05-27 MED ORDER — TRELEGY ELLIPTA 100-62.5-25 MCG/ACT IN AEPB: 1.0000 | INHALATION_SPRAY | Freq: Every day | RESPIRATORY_TRACT | 3 refills | Status: DC

## 2022-06-18 ENCOUNTER — Ambulatory Visit: Payer: 59 | Admitting: Pulmonary Disease

## 2022-06-29 ENCOUNTER — Other Ambulatory Visit (HOSPITAL_COMMUNITY): Payer: 59

## 2022-07-20 ENCOUNTER — Ambulatory Visit: Payer: 59 | Admitting: Cardiology

## 2022-12-07 IMAGING — DX DG CHEST 2V
2 series · 2 of 2 positions shown · non-contrast
Comparison: None Available.

CLINICAL DATA: Left chest pain.

EXAM:
CHEST - 2 VIEW

[chest pa]
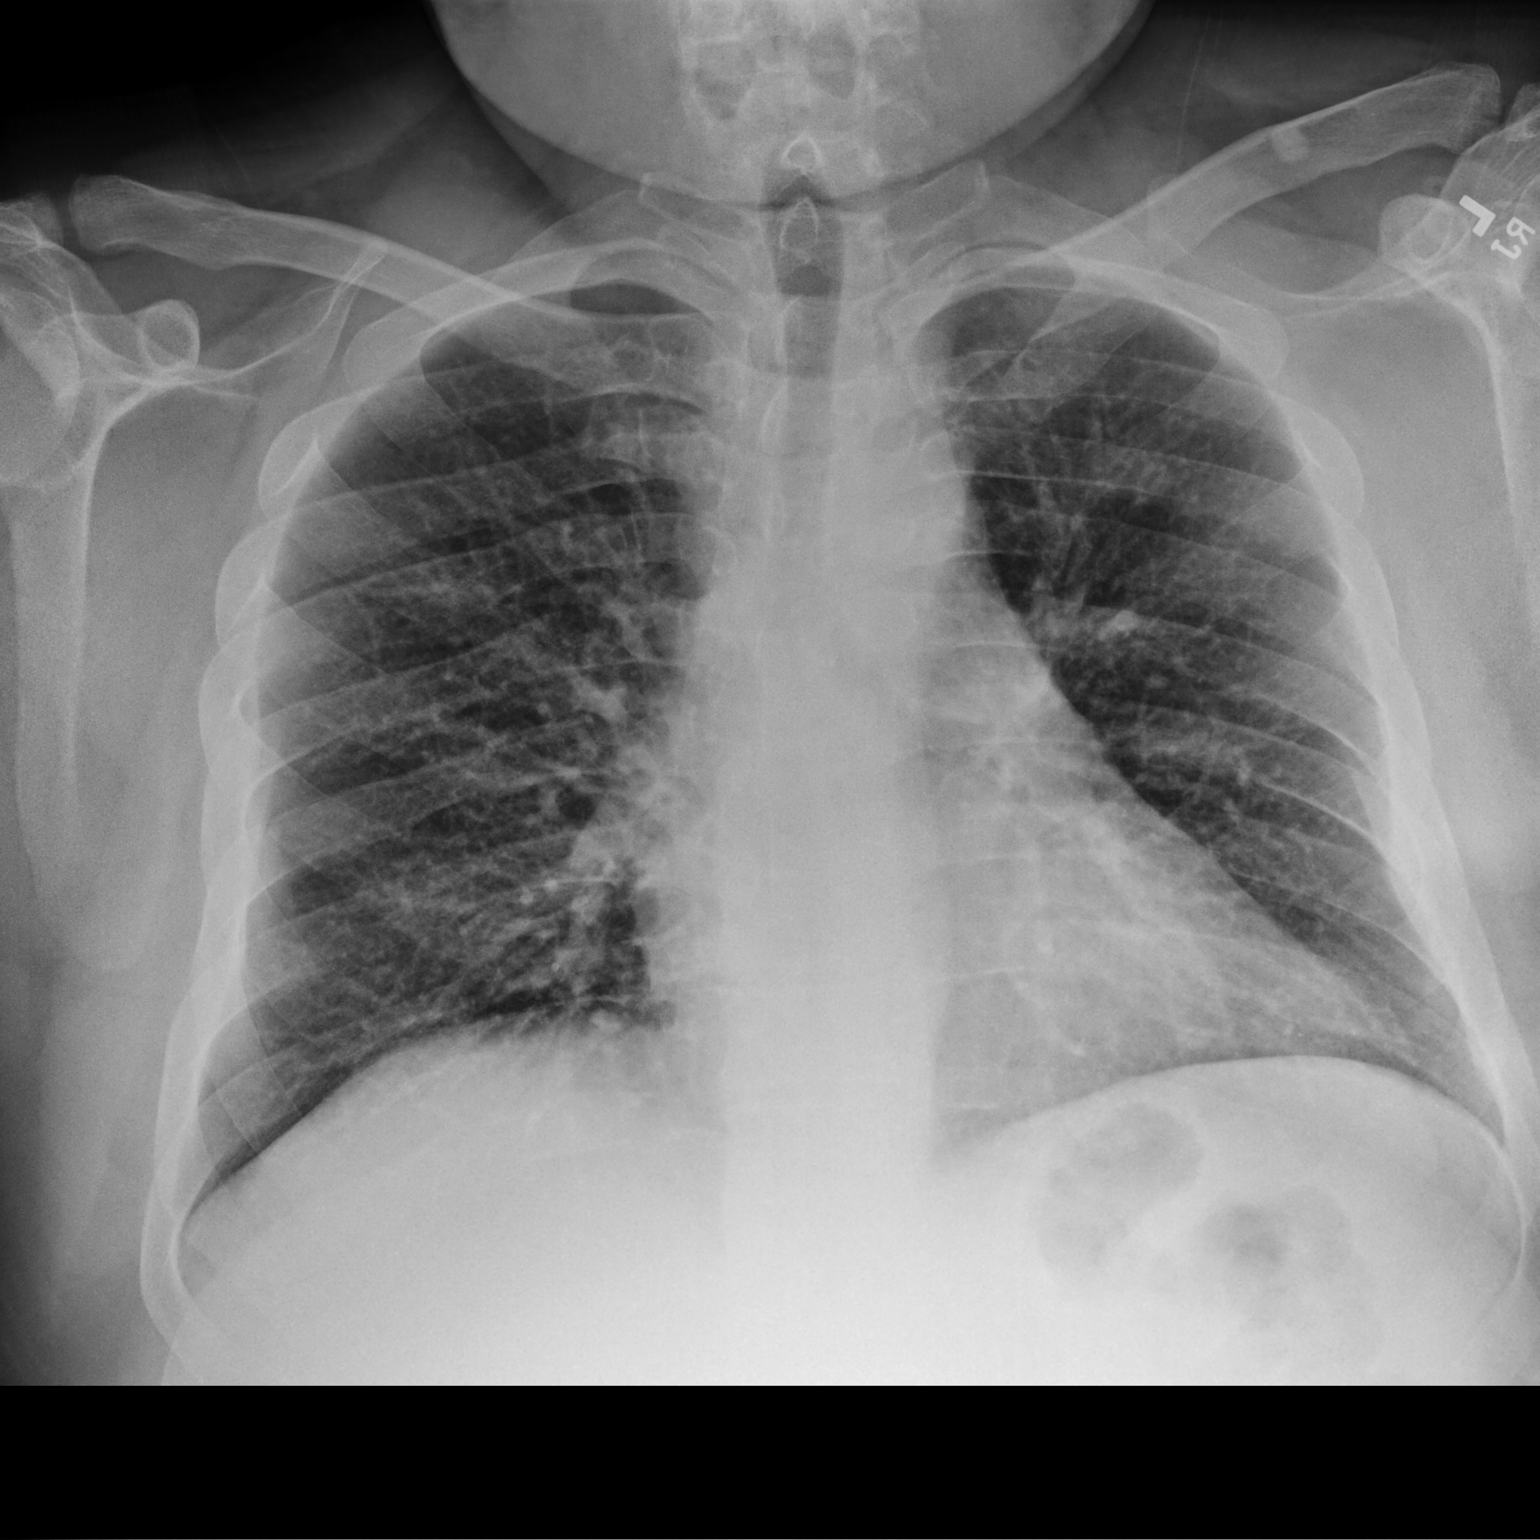

[chest lat]
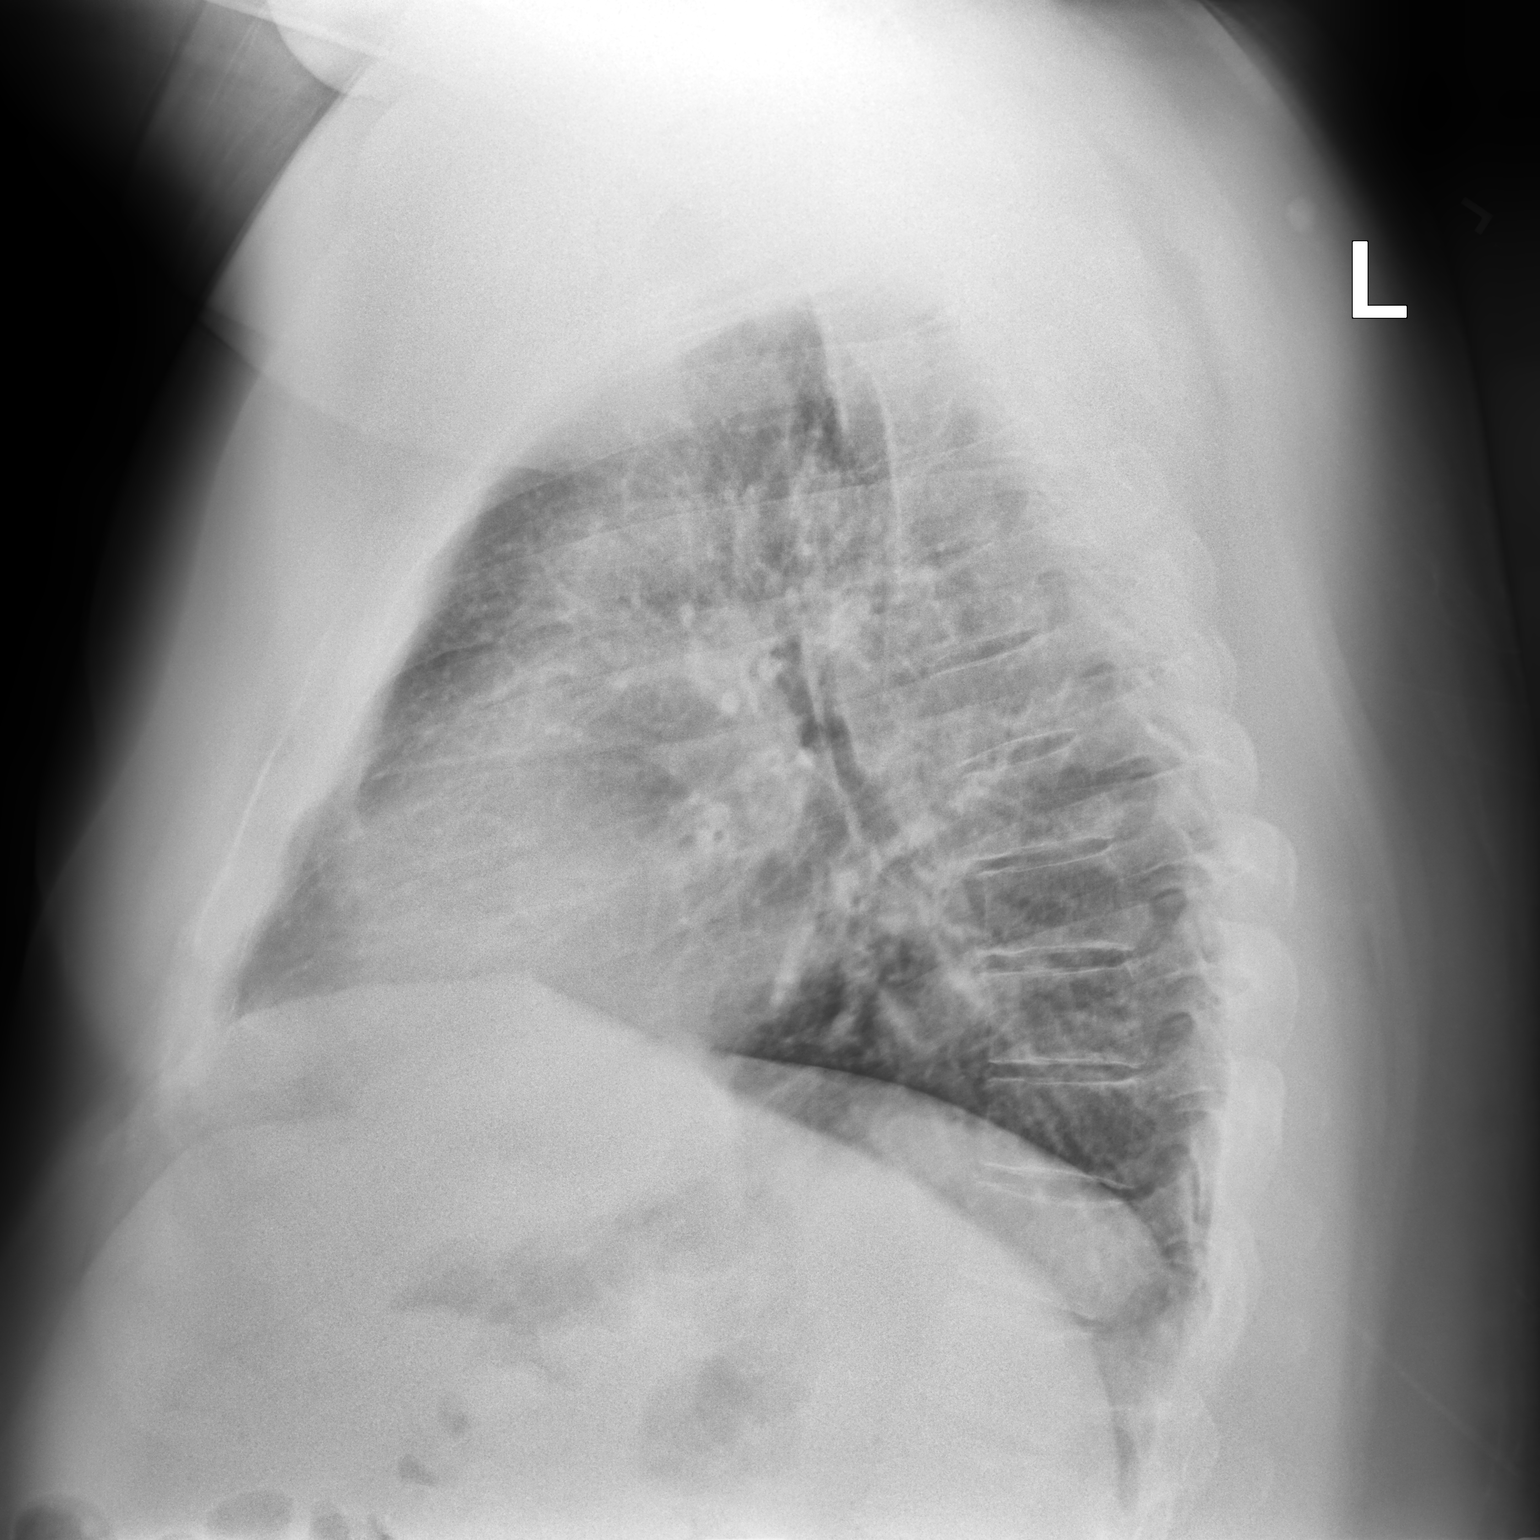

[2 of 2 positions shown; findings below may reference images not displayed]

FINDINGS: Normal cardiac and mediastinal contours. Bilateral interstitial
pulmonary opacities. No pleural effusion or pneumothorax. Osseous
structures unremarkable.
IMPRESSION: Bilateral interstitial opacities may represent mild interstitial
edema or atypical infectious process.

## 2023-07-13 ENCOUNTER — Encounter: Payer: Self-pay | Admitting: Gastroenterology

## 2023-08-10 ENCOUNTER — Ambulatory Visit (AMBULATORY_SURGERY_CENTER): Payer: Managed Care, Other (non HMO)

## 2023-08-10 VITALS — Ht 77.0 in | Wt 370.0 lb

## 2023-08-10 DIAGNOSIS — Z860100 Personal history of colon polyps, unspecified: Secondary | ICD-10-CM

## 2023-08-10 MED ORDER — SUTAB 1479-225-188 MG PO TABS: 24.0000 | ORAL_TABLET | Freq: Once | ORAL | 0 refills | Status: AC

## 2023-08-10 NOTE — Progress Notes (Signed)
No egg or soy allergy known to patient  No issues known to pt with past sedation with any surgeries or procedures Patient denies ever being told they had issues or difficulty with intubation  No FH of Malignant Hyperthermia Pt is not on diet pills Pt is not on  home 02  Pt is not on blood thinners  Pt denies issues with chronic constipation  No A fib or A flutter Have any cardiac testing pending--no Patient's chart reviewed by Cathlyn Parsons CNRA prior to previsit and patient appropriate for the [facility].  Previsit completed and red dot placed by patient's name on their procedure day (on provider's schedule).    Pt instructed to use Singlecare.com or GoodRx for a price reduction on prep  Ambulates independently

## 2023-08-11 ENCOUNTER — Encounter: Payer: Self-pay | Admitting: Gastroenterology

## 2023-08-11 ENCOUNTER — Telehealth: Payer: Self-pay | Admitting: Gastroenterology

## 2023-08-11 NOTE — Telephone Encounter (Signed)
Patient wife called and stated they spoke to their insurance and their insurance told them that they would need to pay 1000 out of pocket for her husbands colonoscopy procedure. Patient wife stated she would like to talk to someone who can let her know when her husband procedure be classified as routine. Patient wife is requesting a call back. Please advise.

## 2023-08-19 ENCOUNTER — Ambulatory Visit: Payer: 59 | Admitting: Gastroenterology

## 2023-08-19 ENCOUNTER — Encounter: Payer: Self-pay | Admitting: Gastroenterology

## 2023-08-19 VITALS — BP 133/87 | HR 89 | Temp 98.8°F | Resp 14 | Ht 77.0 in | Wt 370.0 lb

## 2023-08-19 DIAGNOSIS — D123 Benign neoplasm of transverse colon: Secondary | ICD-10-CM

## 2023-08-19 DIAGNOSIS — Z860100 Personal history of colon polyps, unspecified: Secondary | ICD-10-CM

## 2023-08-19 DIAGNOSIS — D128 Benign neoplasm of rectum: Secondary | ICD-10-CM

## 2023-08-19 DIAGNOSIS — Q438 Other specified congenital malformations of intestine: Secondary | ICD-10-CM | POA: Diagnosis not present

## 2023-08-19 DIAGNOSIS — Z1211 Encounter for screening for malignant neoplasm of colon: Secondary | ICD-10-CM

## 2023-08-19 DIAGNOSIS — K648 Other hemorrhoids: Secondary | ICD-10-CM

## 2023-08-19 DIAGNOSIS — K635 Polyp of colon: Secondary | ICD-10-CM

## 2023-08-19 DIAGNOSIS — D125 Benign neoplasm of sigmoid colon: Secondary | ICD-10-CM

## 2023-08-19 DIAGNOSIS — Z860101 Personal history of adenomatous and serrated colon polyps: Secondary | ICD-10-CM | POA: Diagnosis not present

## 2023-08-19 MED ORDER — SODIUM CHLORIDE 0.9 % IV SOLN: 500.0000 mL | INTRAVENOUS | Status: AC

## 2023-08-19 NOTE — Progress Notes (Signed)
Sedate, gd SR, tolerated procedure well, VSS, report to RN 

## 2023-08-19 NOTE — Op Note (Signed)
[facility] Patient Name: Jorge Mcmahon Procedure Date: 08/19/2023 1:20 PM MRN: [id number] Endoscopist: Viviann Spare P. Adela Lank , MD, [id number] Age: 47 Referring MD:  Date of Birth: October 05, 1975 Gender: Male Account #: [id number] Procedure:                Colonoscopy Indications:              High risk colon cancer surveillance: Personal                            history of colonic polyps - 6 adenomas 08/2020 Medicines:                Monitored Anesthesia Care Procedure:                Pre-Anesthesia Assessment:                           - Prior to the procedure, a History and Physical                            was performed, and patient medications and                            allergies were reviewed. The patient's tolerance of                            previous anesthesia was also reviewed. The risks                            and benefits of the procedure and the sedation                            options and risks were discussed with the patient.                            All questions were answered, and informed consent                            was obtained. Prior Anticoagulants: The patient has                            taken no anticoagulant or antiplatelet agents. ASA                            Grade Assessment: III - A patient with severe                            systemic disease. After reviewing the risks and                            benefits, the patient was deemed in satisfactory                            condition to undergo the procedure.  After obtaining informed consent, the colonoscope                            was passed under direct vision. Throughout the                            procedure, the patient's blood pressure, pulse, and                            oxygen saturations were monitored continuously. The                            Olympus Scope SN: [id number] was introduced through                            the  anus and advanced to the the cecum, identified                            by appendiceal orifice and ileocecal valve. The                            colonoscopy was performed without difficulty. The                            patient tolerated the procedure well. The quality                            of the bowel preparation was adequate. The                            ileocecal valve, appendiceal orifice, and rectum                            were photographed. Scope In: 1:22:59 PM Scope Out: 1:48:25 PM Scope Withdrawal Time: 0 hours 15 minutes 1 second  Total Procedure Duration: 0 hours 25 minutes 26 seconds  Findings:                 The perianal and digital rectal examinations were                            normal.                           Two flat and sessile polyps were found in the                            transverse colon. The polyps were 3 to 5 mm in                            size. These polyps were removed with a cold snare.                            Resection and retrieval were complete.  A 3 mm polyp was found in the distal sigmoid colon.                            The polyp was sessile. The polyp was removed with a                            cold snare. Resection and retrieval were complete.                           Four sessile polyps were found in the rectum. The                            polyps were 2 to 3 mm in size. These polyps were                            removed with a cold snare. Resection and retrieval                            were complete.                           The colon was long and redundant.                           A large amount of liquid stool was found in the                            entire colon, making visualization difficult.                            Lavage of the colon was performed using copious                            amounts of sterile water, resulting in clearance                            with  adequate visualization.                           Internal hemorrhoids were found during retroflexion.                           The exam was otherwise without abnormality. Complications:            No immediate complications. Estimated blood loss:                            Minimal. Estimated Blood Loss:     Estimated blood loss was minimal. Impression:               - Two 3 to 5 mm polyps in the transverse colon,                            removed with a cold snare. Resected  and retrieved.                           - One 3 mm polyp in the distal sigmoid colon,                            removed with a cold snare. Resected and retrieved.                           - Four 2 to 3 mm polyps in the rectum, removed with                            a cold snare. Resected and retrieved.                           - Redundant / long colon.                           - Residual stool leading to significant lavage.                           - Internal hemorrhoids.                           - The examination was otherwise normal. Recommendation:           - Patient has a contact number available for                            emergencies. The signs and symptoms of potential                            delayed complications were discussed with the                            patient. Return to normal activities tomorrow.                            Written discharge instructions were provided to the                            patient.                           - Resume previous diet.                           - Continue present medications.                           - Await pathology results. Viviann Spare P. Alena Blankenbeckler, MD 08/19/2023 1:56:12 PM This report has been signed electronically.

## 2023-08-19 NOTE — Progress Notes (Signed)
[facility] History and Physical   Primary Care Physician:  [facility], [facility]   Reason for Procedure:   History of colon polyps  Plan:    colonoscopy     HPI: Jorge NOVAKOVIC Sr. is a 47 y.o. male  here for colonoscopy surveillance - 6 adenomas removed 08/2020.   Patient denies any bowel symptoms at this time. No family history of colon cancer known. Otherwise feels well without any cardiopulmonary symptoms.   I have discussed risks / benefits of anesthesia and endoscopic procedure with [name] and they wish to proceed with the exams as outlined today.    Past Medical History:  Diagnosis Date   Abnormal EKG    prolonged/borderline prolonged QT   Arthritis    Bronchitis    Dyspnea    Edema    Fatigue    Headache    migraines   History of colon polyps    Lightheaded    Morbid obesity ([facility])    Pneumonia    Shortness of breath 02/18/2022   Echocardiogram 02/2022: EF 55-60, no RWMA, normal RVSF, trivial MR   Sleep apnea    uses cpap   Tobacco abuse    Weight gain     Past Surgical History:  Procedure Laterality Date   COLONOSCOPY  08/08/2020   [facility]   COLONOSCOPY  2001   [facility], 5 polyps   MYRINGOTOMY WITH TUBE PLACEMENT Left 06/10/2021   Procedure: MYRINGOTOMY WITH TUBE PLACEMENT;  Surgeon: Newman Pies, MD;  Location: [facility] OR;  Service: ENT;  Laterality: Left;   NASAL SEPTOPLASTY W/ TURBINOPLASTY Bilateral 06/10/2021   Procedure: NASAL SEPTOPLASTY WITH TURBINATE REDUCTION;  Surgeon: Newman Pies, MD;  Location: [facility] OR;  Service: ENT;  Laterality: Bilateral;   NECK SURGERY     "12 steel sutures in his posterior neck" following traumatic breech delivery   POLYPECTOMY      Prior to Admission medications   Medication Sig Start Date End Date Taking? Authorizing Provider  hydrochlorothiazide (HYDRODIURIL) 25 MG tablet Take 25 mg by mouth every morning. 06/04/23  Yes [provider]  acetaminophen (TYLENOL) 500 MG tablet Take  1,000-2,000 mg by mouth daily as needed for moderate pain.    [provider]  albuterol (VENTOLIN HFA) 108 (90 Base) MCG/ACT inhaler TAKE 2 PUFFS BY MOUTH EVERY 6 HOURS AS NEEDED FOR WHEEZE OR SHORTNESS OF BREATH 04/02/22   Hunsucker, Lesia Sago, MD  B-D 3CC LUER-LOK SYR 18GX1-1/2 18G X 1-1/2" 3 ML MISC USE 1 (ONE) SYRINGE EVERY TWO WEEKS 07/29/23   [provider]  EPINEPHrine 0.3 mg/0.3 mL IJ SOAJ injection Inject into the muscle once as needed. Patient not taking: Reported on 08/10/2023 04/06/23   [provider]  nystatin cream (MYCOSTATIN) Apply 1 Application topically 3 (three) times daily as needed. 05/05/23   [provider]  testosterone cypionate (DEPOTESTOSTERONE CYPIONATE) 200 MG/ML injection Inject 200 mg into the muscle every 14 (fourteen) days. 06/24/23   [provider]    Current Outpatient Medications  Medication Sig Dispense Refill   hydrochlorothiazide (HYDRODIURIL) 25 MG tablet Take 25 mg by mouth every morning.     acetaminophen (TYLENOL) 500 MG tablet Take 1,000-2,000 mg by mouth daily as needed for moderate pain.     albuterol (VENTOLIN HFA) 108 (90 Base) MCG/ACT inhaler TAKE 2 PUFFS BY MOUTH EVERY 6 HOURS AS NEEDED FOR WHEEZE OR SHORTNESS OF BREATH 8.5 each 11   B-D 3CC LUER-LOK SYR 18GX1-1/2 18G X  1-1/2" 3 ML MISC USE 1 (ONE) SYRINGE EVERY TWO WEEKS     EPINEPHrine 0.3 mg/0.3 mL IJ SOAJ injection Inject into the muscle once as needed. (Patient not taking: Reported on 08/10/2023)     nystatin cream (MYCOSTATIN) Apply 1 Application topically 3 (three) times daily as needed.     testosterone cypionate (DEPOTESTOSTERONE CYPIONATE) 200 MG/ML injection Inject 200 mg into the muscle every 14 (fourteen) days.     Current Facility-Administered Medications  Medication Dose Route Frequency Provider Last Rate Last Admin   0.9 %  sodium chloride infusion  500 mL Intravenous Continuous Berkley Cronkright, Willaim Rayas, MD        Allergies as of  08/19/2023 - Review Complete 08/19/2023  Allergen Reaction Noted   Sulfa antibiotics Shortness Of Breath and Swelling 03/20/2020    Family History  Problem Relation Age of Onset   Hyperlipidemia Mother    Heart disease Father        CHF   Heart Problems Paternal Uncle        MI   Hypertension Maternal Grandmother    Asthma Maternal Grandmother    Hypertension Maternal Grandfather    Heart Problems Maternal Grandfather    Stomach cancer Paternal Grandmother    Colon polyps Neg Hx    Colon cancer Neg Hx    Pancreatic cancer Neg Hx    Esophageal cancer Neg Hx    Liver disease Neg Hx    Rectal cancer Neg Hx     Social History   Socioeconomic History   Marital status: Married    Spouse name: Not on file   Number of children: Not on file   Years of education: Not on file   Highest education level: Not on file  Occupational History   Not on file  Tobacco Use   Smoking status: Every Day    Current packs/day: 1.00    Average packs/day: 1 pack/day for 31.9 years (31.9 ttl pk-yrs)    Types: Cigarettes    Start date: 10/11/1991   Smokeless tobacco: Never   Tobacco comments:    Pt is smoking 1 ppd. 01/11/22 ALS   Vaping Use   Vaping status: Never Used  Substance and Sexual Activity   Alcohol use: Never   Drug use: Never   Sexual activity: Not on file  Other Topics Concern   Not on file  Social History Narrative   Not on file   Social Drivers of Health   Financial Resource Strain: Not on file  Food Insecurity: Not on file  Transportation Needs: Not on file  Physical Activity: Not on file  Stress: Not on file  Social Connections: Not on file  Intimate Partner Violence: Not on file    Review of Systems: All other review of systems negative except as mentioned in the HPI.  Physical Exam: Vital signs BP 137/84   Pulse 99   Temp 98.8 F (37.1 C) (Temporal)   Ht 6\' 5"  (1.956 m)   Wt (!) 370 lb (167.8 kg)   SpO2 94%   BMI 43.88 kg/m   General:   Alert,   Well-developed, pleasant and cooperative in NAD Lungs:  Clear throughout to auscultation.   Heart:  Regular rate and rhythm Abdomen:  Soft, nontender and nondistended.   Neuro/Psych:  Alert and cooperative. Normal mood and affect. A and O x 3  [name], MD [facility]

## 2023-08-19 NOTE — Patient Instructions (Signed)
Recommendation: - Patient has a contact number available for emergencies. The signs and symptoms of potential delayed complications were discussed with the patient. Return to normal activities tomorrow. Written discharge instructions were provided to the patient. - Resume previous diet. - Continue present medications. - Await pathology results.  See handouts provided by discharge nurse: Polyps, Hemorrhoids;  YOU HAD AN ENDOSCOPIC PROCEDURE TODAY AT THE [facility]:   Refer to the procedure report that was given to you for any specific questions about what was found during the examination.  If the procedure report does not answer your questions, please call your gastroenterologist to clarify.  If you requested that your care partner not be given the details of your procedure findings, then the procedure report has been included in a sealed envelope for you to review at your convenience later.  YOU SHOULD EXPECT: Some feelings of bloating in the abdomen. Passage of more gas than usual.  Walking can help get rid of the air that was put into your GI tract during the procedure and reduce the bloating. If you had a lower endoscopy (such as a colonoscopy or flexible sigmoidoscopy) you may notice spotting of blood in your stool or on the toilet paper. If you underwent a bowel prep for your procedure, you may not have a normal bowel movement for a few days.  Please Note:  You might notice some irritation and congestion in your nose or some drainage.  This is from the oxygen used during your procedure.  There is no need for concern and it should clear up in a day or so.  SYMPTOMS TO REPORT IMMEDIATELY:  Following lower endoscopy (colonoscopy or flexible sigmoidoscopy):  Excessive amounts of blood in the stool  Significant tenderness or worsening of abdominal pains  Swelling of the abdomen that is new, acute  Fever of 100F or higher  For urgent or emergent issues, a gastroenterologist can be  reached at any hour by calling [phone]. Do not use MyChart messaging for urgent concerns.    DIET:  We do recommend a small meal at first, but then you may proceed to your regular diet.  Drink plenty of fluids but you should avoid alcoholic beverages for 24 hours.  ACTIVITY:  You should plan to take it easy for the rest of today and you should NOT DRIVE or use heavy machinery until tomorrow (because of the sedation medicines used during the test).    FOLLOW UP: Our staff will call the number listed on your records the next business day following your procedure.  We will call around 7:15- 8:00 am to check on you and address any questions or concerns that you may have regarding the information given to you following your procedure. If we do not reach you, we will leave a message.     If any biopsies were taken you will be contacted by phone or by letter within the next 1-3 weeks.  Please call us at [phone] if you have not heard about the biopsies in 3 weeks.    SIGNATURES/CONFIDENTIALITY: You and/or your care partner have signed paperwork which will be entered into your electronic medical record.  These signatures attest to the fact that that the information above on your After Visit Summary has been reviewed and is understood.  Full responsibility of the confidentiality of this discharge information lies with you and/or your care-partner.

## 2023-08-19 NOTE — Progress Notes (Signed)
Pt's states no medical or surgical changes since previsit or office visit. 

## 2023-08-19 NOTE — Progress Notes (Signed)
Called to room to assist during endoscopic procedure.  Patient ID and intended procedure confirmed with present staff. Received instructions for my participation in the procedure from the performing physician.  

## 2023-08-22 ENCOUNTER — Telehealth: Payer: Self-pay

## 2023-08-22 NOTE — Telephone Encounter (Signed)
  Follow up Call-     08/19/2023   12:56 PM  Call back number  Post procedure Call Back phone  # [phone]  Permission to leave phone message Yes     Patient questions:  Do you have a fever, pain , or abdominal swelling? No. Pain Score  0 *  Have you tolerated food without any problems? Yes.    Have you been able to return to your normal activities? Yes.    Do you have any questions about your discharge instructions: Diet   No. Medications  No. Follow up visit  No.  Do you have questions or concerns about your Care? No.  Actions: * If pain score is 4 or above: No action needed, pain <4.

## 2023-08-24 LAB — SURGICAL PATHOLOGY
